# Patient Record
Sex: Female | Born: 1965 | Race: White | Hispanic: No | Smoking: Never smoker
Health system: Southern US, Community
[De-identification: ages and names within clinical notes are randomized; demographics above are authoritative.]

## PROBLEM LIST (undated history)

## (undated) DIAGNOSIS — N200 Calculus of kidney: Secondary | ICD-10-CM

## (undated) DIAGNOSIS — J45909 Unspecified asthma, uncomplicated: Secondary | ICD-10-CM

## (undated) DIAGNOSIS — N390 Urinary tract infection, site not specified: Secondary | ICD-10-CM

## (undated) DIAGNOSIS — G935 Compression of brain: Secondary | ICD-10-CM

## (undated) DIAGNOSIS — F419 Anxiety disorder, unspecified: Secondary | ICD-10-CM

## (undated) DIAGNOSIS — F32A Depression, unspecified: Secondary | ICD-10-CM

## (undated) DIAGNOSIS — F329 Major depressive disorder, single episode, unspecified: Secondary | ICD-10-CM

## (undated) DIAGNOSIS — G4730 Sleep apnea, unspecified: Secondary | ICD-10-CM

## (undated) DIAGNOSIS — K760 Fatty (change of) liver, not elsewhere classified: Secondary | ICD-10-CM

## (undated) DIAGNOSIS — K219 Gastro-esophageal reflux disease without esophagitis: Secondary | ICD-10-CM

## (undated) DIAGNOSIS — K529 Noninfective gastroenteritis and colitis, unspecified: Secondary | ICD-10-CM

## (undated) HISTORY — PX: ABDOMINAL HYSTERECTOMY: SHX81

## (undated) HISTORY — PX: BREAST EXCISIONAL BIOPSY: SUR124

## (undated) HISTORY — DX: Depression, unspecified: F32.A

## (undated) HISTORY — PX: CHOLECYSTECTOMY: SHX55

## (undated) HISTORY — DX: Urinary tract infection, site not specified: N39.0

## (undated) HISTORY — DX: Compression of brain: G93.5

## (undated) HISTORY — DX: Major depressive disorder, single episode, unspecified: F32.9

## (undated) HISTORY — PX: CARPAL TUNNEL RELEASE: SHX101

## (undated) HISTORY — PX: SHOULDER SURGERY: SHX246

## (undated) HISTORY — DX: Sleep apnea, unspecified: G47.30

## (undated) HISTORY — DX: Calculus of kidney: N20.0

---

## 2015-11-21 ENCOUNTER — Emergency Department (HOSPITAL_COMMUNITY)
Admission: EM | Admit: 2015-11-21 | Discharge: 2015-11-21 | Disposition: A | Discharge status: Home / Self Care | Payer: Medicaid - Out of State | Attending: Emergency Medicine | Admitting: Emergency Medicine

## 2015-11-21 ENCOUNTER — Encounter (HOSPITAL_COMMUNITY): Payer: Self-pay | Admitting: Emergency Medicine

## 2015-11-21 DIAGNOSIS — Y9289 Other specified places as the place of occurrence of the external cause: Secondary | ICD-10-CM | POA: Insufficient documentation

## 2015-11-21 DIAGNOSIS — Y9389 Activity, other specified: Secondary | ICD-10-CM | POA: Insufficient documentation

## 2015-11-21 DIAGNOSIS — J45909 Unspecified asthma, uncomplicated: Secondary | ICD-10-CM | POA: Insufficient documentation

## 2015-11-21 DIAGNOSIS — Y998 Other external cause status: Secondary | ICD-10-CM | POA: Diagnosis not present

## 2015-11-21 DIAGNOSIS — X509XXA Other and unspecified overexertion or strenuous movements or postures, initial encounter: Secondary | ICD-10-CM | POA: Diagnosis not present

## 2015-11-21 DIAGNOSIS — S99912A Unspecified injury of left ankle, initial encounter: Secondary | ICD-10-CM | POA: Diagnosis not present

## 2015-11-21 HISTORY — DX: Compression of brain: G93.5

## 2015-11-21 HISTORY — DX: Unspecified asthma, uncomplicated: J45.909

## 2015-11-21 HISTORY — DX: Gastro-esophageal reflux disease without esophagitis: K21.9

## 2015-11-21 NOTE — ED Notes (Signed)
Was unable to find the pt to recheck vitals at aprox [phone]

## 2015-11-21 NOTE — ED Notes (Signed)
Called pt for room placement in sub-waiting and in main waiting room twice with no answer. Lawson Fiscal, RN made aware.

## 2015-11-21 NOTE — ED Notes (Signed)
Patient called several times to go to radiology and to be placed in room but no answer

## 2015-11-21 NOTE — ED Notes (Signed)
Pt sts left ankle pain after twisting 2 hours ago with pain in heel; CMS intact

## 2016-03-30 ENCOUNTER — Encounter (HOSPITAL_COMMUNITY): Payer: Self-pay | Admitting: Emergency Medicine

## 2016-03-30 ENCOUNTER — Observation Stay (HOSPITAL_COMMUNITY)
Admission: EM | Admit: 2016-03-30 | Discharge: 2016-03-31 | Disposition: A | Discharge status: Home / Self Care | Payer: Medicaid Other | Attending: Family Medicine | Admitting: Family Medicine

## 2016-03-30 DIAGNOSIS — I6529 Occlusion and stenosis of unspecified carotid artery: Secondary | ICD-10-CM | POA: Diagnosis not present

## 2016-03-30 DIAGNOSIS — R350 Frequency of micturition: Secondary | ICD-10-CM | POA: Insufficient documentation

## 2016-03-30 DIAGNOSIS — M25512 Pain in left shoulder: Secondary | ICD-10-CM | POA: Insufficient documentation

## 2016-03-30 DIAGNOSIS — R079 Chest pain, unspecified: Secondary | ICD-10-CM | POA: Diagnosis present

## 2016-03-30 DIAGNOSIS — Z8249 Family history of ischemic heart disease and other diseases of the circulatory system: Secondary | ICD-10-CM | POA: Insufficient documentation

## 2016-03-30 DIAGNOSIS — E785 Hyperlipidemia, unspecified: Secondary | ICD-10-CM | POA: Insufficient documentation

## 2016-03-30 DIAGNOSIS — K219 Gastro-esophageal reflux disease without esophagitis: Secondary | ICD-10-CM | POA: Insufficient documentation

## 2016-03-30 DIAGNOSIS — F329 Major depressive disorder, single episode, unspecified: Secondary | ICD-10-CM | POA: Insufficient documentation

## 2016-03-30 DIAGNOSIS — R631 Polydipsia: Secondary | ICD-10-CM | POA: Diagnosis not present

## 2016-03-30 DIAGNOSIS — I25110 Atherosclerotic heart disease of native coronary artery with unstable angina pectoris: Secondary | ICD-10-CM | POA: Insufficient documentation

## 2016-03-30 DIAGNOSIS — Z7982 Long term (current) use of aspirin: Secondary | ICD-10-CM | POA: Diagnosis not present

## 2016-03-30 DIAGNOSIS — G4733 Obstructive sleep apnea (adult) (pediatric): Secondary | ICD-10-CM | POA: Insufficient documentation

## 2016-03-30 DIAGNOSIS — E669 Obesity, unspecified: Secondary | ICD-10-CM | POA: Diagnosis not present

## 2016-03-30 DIAGNOSIS — R072 Precordial pain: Principal | ICD-10-CM | POA: Insufficient documentation

## 2016-03-30 DIAGNOSIS — J45909 Unspecified asthma, uncomplicated: Secondary | ICD-10-CM | POA: Diagnosis not present

## 2016-03-30 DIAGNOSIS — K760 Fatty (change of) liver, not elsewhere classified: Secondary | ICD-10-CM | POA: Insufficient documentation

## 2016-03-30 HISTORY — DX: Anxiety disorder, unspecified: F41.9

## 2016-03-30 HISTORY — DX: Noninfective gastroenteritis and colitis, unspecified: K52.9

## 2016-03-30 HISTORY — DX: Fatty (change of) liver, not elsewhere classified: K76.0

## 2016-03-30 MED ORDER — NITROGLYCERIN 0.4 MG SL SUBL
0.4000 mg | SUBLINGUAL_TABLET | SUBLINGUAL | Status: DC | PRN
  Administered 2016-03-31: 0.4 mg via SUBLINGUAL
  Filled 2016-03-30: qty 1

## 2016-03-30 NOTE — ED Provider Notes (Addendum)
CSN: [id number]     Arrival date & time 03/30/16  2342 History  By signing my name below, I, Vista Minkobert Ross, attest that this documentation has been prepared under the direction and in the presence of Jaci Carrelhristopher Miklo Aken, MD. Electronically signed, Vista Minkobert Ross, ED Scribe. 03/31/2016. 12:03 AM.    Chief Complaint  Patient presents with  . Chest Pain   The history is provided by the patient. No language interpreter was used.   HPI Comments: Megan [name] is a 50 y.o. female with a PMHx of cholecystectomy, carotid artery disease, and hysterectomy, Colitis, brought in by ambulance, who presents to the Emergency Department complaining of intermittent sharp central chest pain that radiates to her left chest, left arm and neck. Pt also reports associated tingling in her left hand. Pt states her chest pain has gradually worsened today. Pt reports that she was at the grocery store earlier today when she started to feel flushed and nauseas and felt as though she was going to pass out. Pt further reports that she has had increased thirst recently. Pt states that she has never been diagnosed with hypertension but reports that she has had occasional high blood pressure. Pt also reports she takes medication for high cholesterol. Pt reports no alleviating or exacerbating factors. Pt denies being diabetic. Pt states that she is not a smoker.   Past Medical History  Diagnosis Date  . Asthma   . GERD (gastroesophageal reflux disease)   . Chiari malformation type I (HCC)    History reviewed. No pertinent past surgical history. No family history on file. Social History  Substance Use Topics  . Smoking status: Never Smoker   . Smokeless tobacco: None  . Alcohol Use: No   OB History    No data available     Review of Systems  Constitutional: Positive for diaphoresis.  Cardiovascular: Positive for chest pain.  Gastrointestinal: Positive for nausea.  All other systems reviewed and are  negative.     Allergies  Sulfa antibiotics and Nsaids  Home Medications   Prior to Admission medications   Medication Sig Start Date End Date Taking? Authorizing Provider  aspirin EC 81 MG tablet Take 81 mg by mouth daily.   Yes Historical Provider, MD  DULERA 100-5 MCG/ACT AERO Take 2 puffs by mouth 2 (two) times daily. 01/01/16  Yes Historical Provider, MD  ibuprofen (ADVIL,MOTRIN) 800 MG tablet Take 800 mg by mouth 2 (two) times daily as needed for mild pain.  03/06/16  Yes Historical Provider, MD  loratadine (CLARITIN) 10 MG tablet Take 10 mg by mouth daily. 03/06/16  Yes Historical Provider, MD  montelukast (SINGULAIR) 10 MG tablet Take 10 mg by mouth daily. 03/06/16  Yes Historical Provider, MD  NEXIUM 24HR 20 MG capsule Take 40 mg by mouth daily. 03/06/16  Yes Historical Provider, MD  Omega-3 Fatty Acids (FISH OIL) 1000 MG CAPS Take 1,000 mg by mouth 2 (two) times daily.   Yes Historical Provider, MD  promethazine (PHENERGAN) 25 MG tablet Take 25 mg by mouth every 6 (six) hours as needed for nausea or vomiting.   Yes Historical Provider, MD  ranitidine (ZANTAC) 150 MG tablet Take 150 mg by mouth 2 (two) times daily. 02/12/16  Yes Historical Provider, MD  sertraline (ZOLOFT) 100 MG tablet Take 100 mg by mouth daily. 03/06/16  Yes Historical Provider, MD  simvastatin (ZOCOR) 10 MG tablet Take 10 mg by mouth at bedtime. 01/01/16  Yes Historical Provider, MD   BP 100/77 mmHg  Pulse 76  Temp(Src) 98 F (36.7 C) (Oral)  Resp 13  SpO2 97% Physical Exam  Constitutional: She is oriented to person, place, and time. She appears well-developed and well-nourished. No distress.  HENT:  Head: Normocephalic and atraumatic.  Right Ear: Hearing normal.  Left Ear: Hearing normal.  Nose: Nose normal.  Mouth/Throat: Oropharynx is clear and moist and mucous membranes are normal.  Eyes: Conjunctivae and EOM are normal. Pupils are equal, round, and reactive to light.  Neck: Normal range of motion. Neck  supple.  Cardiovascular: Regular rhythm, S1 normal and S2 normal.  Exam reveals no gallop and no friction rub.   No murmur heard. Pulmonary/Chest: Effort normal and breath sounds normal. No respiratory distress. She exhibits no tenderness.  Abdominal: Soft. Normal appearance and bowel sounds are normal. There is no hepatosplenomegaly. There is no tenderness. There is no rebound, no guarding, no tenderness at McBurney's point and negative Murphy's sign. No hernia.  Musculoskeletal: Normal range of motion.  Neurological: She is alert and oriented to person, place, and time. She has normal strength. No cranial nerve deficit or sensory deficit. Coordination normal. GCS eye subscore is 4. GCS verbal subscore is 5. GCS motor subscore is 6.  Skin: Skin is warm, dry and intact. No rash noted. No cyanosis.  Psychiatric: She has a normal mood and affect. Her speech is normal and behavior is normal. Thought content normal.  Nursing note and vitals reviewed.   ED Course  Procedures   COORDINATION OF CARE: 12:00 AM-Will order blood work and EKG. Discussed treatment plan with pt at bedside and pt agreed to plan.   Labs Review Labs Reviewed  CBC WITH DIFFERENTIAL/PLATELET - Abnormal; Notable for the following:    Hemoglobin 11.3 (*)    HCT 34.8 (*)    Lymphs Abs 4.1 (*)    All other components within normal limits  COMPREHENSIVE METABOLIC PANEL - Abnormal; Notable for the following:    Total Protein 6.2 (*)    All other components within normal limits  TROPONIN I  LIPASE, BLOOD  URINALYSIS, ROUTINE W REFLEX MICROSCOPIC (NOT AT [facility])    Imaging Review Dg Chest Port 1 View  03/31/2016  CLINICAL DATA:  Acute onset of mid chest pain.  Initial encounter. EXAM: PORTABLE CHEST 1 VIEW COMPARISON:  None. FINDINGS: The lungs are well-aerated and clear. There is no evidence of focal opacification, pleural effusion or pneumothorax. The cardiomediastinal silhouette is within normal limits. No acute osseous  abnormalities are seen. IMPRESSION: No acute cardiopulmonary process seen. Electronically Signed   By: Roanna RaiderJeffery  Chang M.D.   On: 03/31/2016 00:12   I have personally reviewed and evaluated these images and lab results as part of my medical decision-making.   EKG Interpretation   Date/Time:  Tuesday March 30 2016 23:54:30 EDT Ventricular Rate:  78 PR Interval:    QRS Duration: 90 QT Interval:  388 QTC Calculation: 442 R Axis:   36 Text Interpretation:  Sinus rhythm Normal ECG Confirmed by Chandlar Guice  MD,  Zyler Hyson [phone]) on 03/30/2016 11:56:46 PM      MDM   Final diagnoses:  Chest pain, unspecified chest pain type    Presents to the emergency department for evaluation of chest pain. Patient has been experiencing intermittent pain for 3 days. She reports occasional sharp component, but mostly complaining of heaviness and tightness in the chest that radiates to the left neck and the left shoulder and arm. She has not, however, noticed any clear exertional component. She  has had some nausea, flushing and feeling like she is going to pass out associated with the pain. She is not short of breath.  HEART Score for Major Cardiac Events from [email or web address]  on 03/30/2016  RESULT SUMMARY: 4 points Moderate Score (4-6 points) Risk of MACE of 12-16.6%.  INPUTS: History -> 1 = Moderately suspicious EKG -> 0 = Normal Age -> 1 = 45-65 Risk factors -> 2 = ?3 risk factors or history of atherosclerotic disease (patient has multiple risk factors AND has known vascular (carotid) disease) Initial troponin -> 0 = ?normal limit  Patient does have multiple cardiac risk factors as outlined above. She is felt to be moderate risk for cardiac etiology and therefore will require hospitalization for cardiac rule out.  I personally performed the services described in this documentation, which was scribed in my presence. The recorded information has been reviewed and is accurate.     Gilda Crease,  MD 03/31/16 1610  Gilda Crease, MD 04/01/16 [phone]

## 2016-03-30 NOTE — ED Notes (Signed)
Pt in EMS from home, reports CP present X3 days. Central with radiation to L arm, L neck. Denies SOB.

## 2016-03-30 NOTE — ED Notes (Signed)
Pt took 324 ASA at home, was admin 1NTG en route with no relief.

## 2016-03-31 ENCOUNTER — Emergency Department (HOSPITAL_COMMUNITY): Payer: Medicaid Other

## 2016-03-31 ENCOUNTER — Other Ambulatory Visit (HOSPITAL_COMMUNITY): Payer: Medicaid - Out of State

## 2016-03-31 ENCOUNTER — Encounter (HOSPITAL_COMMUNITY): Payer: Self-pay | Admitting: General Practice

## 2016-03-31 DIAGNOSIS — R079 Chest pain, unspecified: Secondary | ICD-10-CM | POA: Diagnosis present

## 2016-03-31 DIAGNOSIS — M25512 Pain in left shoulder: Secondary | ICD-10-CM | POA: Diagnosis not present

## 2016-03-31 DIAGNOSIS — R0789 Other chest pain: Secondary | ICD-10-CM | POA: Diagnosis not present

## 2016-03-31 DIAGNOSIS — F419 Anxiety disorder, unspecified: Secondary | ICD-10-CM | POA: Diagnosis not present

## 2016-03-31 LAB — URINALYSIS, ROUTINE W REFLEX MICROSCOPIC
BILIRUBIN URINE: NEGATIVE
Glucose, UA: NEGATIVE mg/dL
HGB URINE DIPSTICK: NEGATIVE
KETONES UR: NEGATIVE mg/dL
Leukocytes, UA: NEGATIVE
NITRITE: NEGATIVE
PH: 5.5 (ref 5.0–8.0)
Protein, ur: NEGATIVE mg/dL
SPECIFIC GRAVITY, URINE: 1.028 (ref 1.005–1.030)

## 2016-03-31 LAB — T4, FREE: FREE T4: 0.7 ng/dL (ref 0.61–1.12)

## 2016-03-31 LAB — COMPREHENSIVE METABOLIC PANEL
ALBUMIN: 3.6 g/dL (ref 3.5–5.0)
ALBUMIN: 3.6 g/dL (ref 3.5–5.0)
ALK PHOS: 57 U/L (ref 38–126)
ALT: 20 U/L (ref 14–54)
ALT: 21 U/L (ref 14–54)
AST: 18 U/L (ref 15–41)
AST: 25 U/L (ref 15–41)
Alkaline Phosphatase: 57 U/L (ref 38–126)
Anion gap: 6 (ref 5–15)
Anion gap: 8 (ref 5–15)
BUN: 16 mg/dL (ref 6–20)
BUN: 17 mg/dL (ref 6–20)
CHLORIDE: 102 mmol/L (ref 101–111)
CHLORIDE: 108 mmol/L (ref 101–111)
CO2: 24 mmol/L (ref 22–32)
CO2: 30 mmol/L (ref 22–32)
CREATININE: 0.79 mg/dL (ref 0.44–1.00)
Calcium: 9.1 mg/dL (ref 8.9–10.3)
Calcium: 9.3 mg/dL (ref 8.9–10.3)
Creatinine, Ser: 0.69 mg/dL (ref 0.44–1.00)
GFR calc Af Amer: >60 mL/min (ref >60)
GFR calc Af Amer: >60 mL/min (ref >60)
GLUCOSE: 102 mg/dL — AB (ref 65–99)
Glucose, Bld: 98 mg/dL (ref 65–99)
POTASSIUM: 4.3 mmol/L (ref 3.5–5.1)
POTASSIUM: 5 mmol/L (ref 3.5–5.1)
SODIUM: 138 mmol/L (ref 135–145)
Sodium: 140 mmol/L (ref 135–145)
Total Bilirubin: 0.7 mg/dL (ref 0.3–1.2)
Total Bilirubin: 0.9 mg/dL (ref 0.3–1.2)
Total Protein: 6.2 g/dL — ABNORMAL LOW (ref 6.5–8.1)
Total Protein: 6.5 g/dL (ref 6.5–8.1)

## 2016-03-31 LAB — TROPONIN I
Troponin I: <0.03 ng/mL (ref <0.03)
Troponin I: <0.03 ng/mL (ref <0.03)

## 2016-03-31 LAB — CBC WITH DIFFERENTIAL/PLATELET
BASOS ABS: 0 10*3/uL (ref 0.0–0.1)
BASOS PCT: 0 %
EOS ABS: 0.3 10*3/uL (ref 0.0–0.7)
EOS PCT: 3 %
HCT: 34.8 % — ABNORMAL LOW (ref 36.0–46.0)
Hemoglobin: 11.3 g/dL — ABNORMAL LOW (ref 12.0–15.0)
Lymphocytes Relative: 39 %
Lymphs Abs: 4.1 10*3/uL — ABNORMAL HIGH (ref 0.7–4.0)
MCH: 29 pg (ref 26.0–34.0)
MCHC: 32.5 g/dL (ref 30.0–36.0)
MCV: 89.5 fL (ref 78.0–100.0)
MONO ABS: 0.7 10*3/uL (ref 0.1–1.0)
Monocytes Relative: 7 %
Neutro Abs: 5.3 10*3/uL (ref 1.7–7.7)
Neutrophils Relative %: 51 %
PLATELETS: 317 10*3/uL (ref 150–400)
RBC: 3.89 MIL/uL (ref 3.87–5.11)
RDW: 13.5 % (ref 11.5–15.5)
WBC: 10.5 10*3/uL (ref 4.0–10.5)

## 2016-03-31 LAB — CBC
HEMATOCRIT: 35.1 % — AB (ref 36.0–46.0)
HEMOGLOBIN: 11.4 g/dL — AB (ref 12.0–15.0)
MCH: 29.1 pg (ref 26.0–34.0)
MCHC: 32.5 g/dL (ref 30.0–36.0)
MCV: 89.5 fL (ref 78.0–100.0)
Platelets: 346 10*3/uL (ref 150–400)
RBC: 3.92 MIL/uL (ref 3.87–5.11)
RDW: 13.6 % (ref 11.5–15.5)
WBC: 10.7 10*3/uL — ABNORMAL HIGH (ref 4.0–10.5)

## 2016-03-31 LAB — TSH: TSH: 4.893 u[IU]/mL — AB (ref 0.350–4.500)

## 2016-03-31 LAB — LIPID PANEL
CHOL/HDL RATIO: 7.8 ratio
CHOLESTEROL: 296 mg/dL — AB (ref 0–200)
HDL: 38 mg/dL — ABNORMAL LOW (ref >40)
LDL CALC: 212 mg/dL — AB (ref 0–99)
Triglycerides: 230 mg/dL — ABNORMAL HIGH (ref <150)
VLDL: 46 mg/dL — AB (ref 0–40)

## 2016-03-31 LAB — LIPASE, BLOOD: LIPASE: 27 U/L (ref 11–51)

## 2016-03-31 MED ORDER — MOMETASONE FURO-FORMOTEROL FUM 100-5 MCG/ACT IN AERO
2.0000 | INHALATION_SPRAY | Freq: Two times a day (BID) | RESPIRATORY_TRACT | Status: DC
Start: 2016-03-31 — End: 2016-03-31
  Administered 2016-03-31: 2 via RESPIRATORY_TRACT
  Filled 2016-03-31 (×2): qty 8.8

## 2016-03-31 MED ORDER — ONDANSETRON HCL 4 MG/2ML IJ SOLN
4.0000 mg | Freq: Four times a day (QID) | INTRAMUSCULAR | Status: DC | PRN
  Administered 2016-03-31 (×2): 4 mg via INTRAVENOUS
  Filled 2016-03-31 (×2): qty 2

## 2016-03-31 MED ORDER — ENOXAPARIN SODIUM 40 MG/0.4ML ~~LOC~~ SOLN
40.0000 mg | SUBCUTANEOUS | Status: DC
  Administered 2016-03-31: 40 mg via SUBCUTANEOUS
  Filled 2016-03-31: qty 0.4

## 2016-03-31 MED ORDER — MELOXICAM 15 MG PO TABS: 15.0000 mg | ORAL_TABLET | Freq: Every day | ORAL | Status: DC

## 2016-03-31 MED ORDER — MORPHINE SULFATE (PF) 2 MG/ML IV SOLN
2.0000 mg | Freq: Once | INTRAVENOUS | Status: AC
  Administered 2016-03-31: 2 mg via INTRAVENOUS
  Filled 2016-03-31: qty 1

## 2016-03-31 MED ORDER — SERTRALINE HCL 100 MG PO TABS
100.0000 mg | ORAL_TABLET | Freq: Every day | ORAL | Status: DC
  Administered 2016-03-31: 100 mg via ORAL
  Filled 2016-03-31: qty 1

## 2016-03-31 MED ORDER — ONDANSETRON HCL 4 MG/2ML IJ SOLN
4.0000 mg | Freq: Once | INTRAMUSCULAR | Status: AC
  Administered 2016-03-31: 4 mg via INTRAVENOUS
  Filled 2016-03-31: qty 2

## 2016-03-31 MED ORDER — MELOXICAM 7.5 MG PO TABS
15.0000 mg | ORAL_TABLET | Freq: Every day | ORAL | Status: DC
  Administered 2016-03-31: 15 mg via ORAL
  Filled 2016-03-31 (×2): qty 1

## 2016-03-31 MED ORDER — MONTELUKAST SODIUM 10 MG PO TABS
10.0000 mg | ORAL_TABLET | Freq: Every day | ORAL | Status: DC
  Administered 2016-03-31: 10 mg via ORAL
  Filled 2016-03-31: qty 1

## 2016-03-31 MED ORDER — PANTOPRAZOLE SODIUM 40 MG PO TBEC
40.0000 mg | DELAYED_RELEASE_TABLET | Freq: Every day | ORAL | Status: DC
  Administered 2016-03-31: 40 mg via ORAL
  Filled 2016-03-31: qty 1

## 2016-03-31 MED ORDER — FAMOTIDINE 20 MG PO TABS
20.0000 mg | ORAL_TABLET | Freq: Every day | ORAL | Status: DC
  Administered 2016-03-31: 20 mg via ORAL
  Filled 2016-03-31: qty 1

## 2016-03-31 MED ORDER — PROMETHAZINE HCL 25 MG PO TABS
12.5000 mg | ORAL_TABLET | Freq: Once | ORAL | Status: AC
  Administered 2016-03-31: 12.5 mg via ORAL
  Filled 2016-03-31: qty 1

## 2016-03-31 MED ORDER — SIMVASTATIN 10 MG PO TABS: 10.0000 mg | ORAL_TABLET | Freq: Every day | ORAL | Status: DC

## 2016-03-31 MED ORDER — ATORVASTATIN CALCIUM 40 MG PO TABS: 40.0000 mg | ORAL_TABLET | Freq: Every day | ORAL | Status: DC

## 2016-03-31 MED ORDER — ASPIRIN EC 81 MG PO TBEC
81.0000 mg | DELAYED_RELEASE_TABLET | Freq: Every day | ORAL | Status: DC
  Administered 2016-03-31: 81 mg via ORAL
  Filled 2016-03-31: qty 1

## 2016-03-31 MED ORDER — ACETAMINOPHEN 325 MG PO TABS: 650.0000 mg | ORAL_TABLET | Freq: Four times a day (QID) | ORAL | Status: DC | PRN

## 2016-03-31 MED ORDER — TRAMADOL HCL 50 MG PO TABS
50.0000 mg | ORAL_TABLET | Freq: Once | ORAL | Status: AC
  Administered 2016-03-31: 50 mg via ORAL
  Filled 2016-03-31: qty 1

## 2016-03-31 MED ORDER — ACETAMINOPHEN 325 MG PO TABS
650.0000 mg | ORAL_TABLET | ORAL | Status: DC | PRN
  Administered 2016-03-31: 650 mg via ORAL
  Filled 2016-03-31: qty 2

## 2016-03-31 MED ORDER — GI COCKTAIL ~~LOC~~
30.0000 mL | Freq: Once | ORAL | Status: AC
  Administered 2016-03-31: 30 mL via ORAL
  Filled 2016-03-31: qty 30

## 2016-03-31 NOTE — Progress Notes (Signed)
Pt continues to be very nauseated and pain 9/10 in LUQ/flank.  MD paged.

## 2016-03-31 NOTE — Consult Note (Signed)
Cardiology Consult    Patient ID: Megan ModeBobbie Burbano MRN: [id number], DOB/AGE: 50/01/1966   Admit date: 03/30/2016 Date of Consult: 03/31/2016  Primary Physician: No primary care provider on file. Reason for Consult: Chest Pain Primary Cardiologist: New Requesting Provider: Dr. Deirdre Priesthambliss   History of Present Illness    Megan [name] is a 50 y.o. 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  She reports having episodes of shooting chest pain which radiates from her sternum, under her left breast, and into her lower back and has been occurring for several years and can occur with rest or exertion. Yesterday afternoon, she developed an episode of nausea and flushing while sitting in her car. Later that evening, while resting on the couch, she developed a pressure along her sternum which radiated under her left breast and into her left neck. The pressure has been present since, waxing and waning in intensity, but never fully going away. Has noted some relief with Tylenol and Tramadol. The pain is exacerbated by taking a deep breath. No positional changes noted.  She does not have any known prior history of CAD. Does report having carotid stenosis which is monitored on a 3-year basis but has not required intervention. Denies any history of HTN or Type 2 DM. Does have HLD and took Simvastatin 10mg  daily prior to admission.  No current or prior tobacco use. No alcohol or recreational drug use. Reports a family history of CAD with her father having CABG in his 7060's.  While admitted, cyclic troponin values have been negative. Creatinine 0.69. Electrolytes WNL. WBC 10.5, Hgb 11.3, platelets 317. TSH 4.893, free T4 0.70. Lipid Panel with Total Cholesterol 296, triglycerides 230, HDL 38, and LDL 212. EKG shows NSR, HR 78 with no acute ST or  T-wave changes. CXR with no acute cardiopulmonary abnormalities.   Past Medical History   Past Medical History  Diagnosis Date  . Asthma   . GERD (gastroesophageal reflux disease)   . Chiari malformation type I (HCC)   . Colitis   . Fatty liver   . Anxiety     Past Surgical History  Procedure Laterality Date  . Carpal tunnel release Left   . Shoulder surgery Left   . Breast lumpectomy Right   . Abdominal hysterectomy    . Cholecystectomy    . Cesarean section       Allergies  Allergies  Allergen Reactions  . Sulfa Antibiotics Anaphylaxis  . Nsaids Nausea And Vomiting    Inpatient Medications    . aspirin EC  81 mg Oral Daily  . atorvastatin  40 mg Oral q1800  . enoxaparin (LOVENOX) injection  40 mg Subcutaneous Q24H  . famotidine  20 mg Oral Daily  . mometasone-formoterol  2 puff Inhalation BID  . montelukast  10 mg Oral Daily  . pantoprazole  40 mg Oral Daily  . sertraline  100 mg Oral Daily    Family History    Family History  Problem Relation Age of Onset  . Heart attack Father     Required CABG in his 6560's.    Social History    Social History   Social History  . Marital Status: Single    Spouse Name: N/A  . Number of Children: N/A  . Years of Education: N/A   Occupational History  .  Not on file.   Social History Main Topics  . Smoking status: Never Smoker   . Smokeless tobacco: Never Used  . Alcohol Use: No  . Drug Use: No  . Sexual Activity: Not on file   Other Topics Concern  . Not on file   Social History Narrative     Review of Systems    General:  No chills, fever, night sweats or weight changes.  Cardiovascular:  No dyspnea on exertion, edema, orthopnea, palpitations, paroxysmal nocturnal dyspnea. Positive for chest pain.  Dermatological: No rash, lesions/masses Respiratory: No cough, dyspnea Urologic: No hematuria, dysuria Abdominal:   No diarrhea, bright red blood per rectum, melena, or hematemesis. Positive for nausea  and vomiting.  Neurologic:  No visual changes, wkns, changes in mental status. All other systems reviewed and are otherwise negative except as noted above.  Physical Exam    Blood pressure 101/53, pulse 68, temperature 98 F (36.7 C), temperature source Oral, resp. rate 16, height 5\' 3"  (1.6 m), weight 213 lb (96.616 kg), SpO2 91 %.  General: Pleasant, Caucasian female appearing in NAD. Psych: Normal affect. Neuro: Alert and oriented X 3. Moves all extremities spontaneously. HEENT: Normal  Neck: Supple without bruits or JVD. Lungs:  Resp regular and unlabored, CTA without wheezing or rales. Heart: RRR no s3, s4, or murmurs. Tender to palpation along sternum and left pectoral region.  Abdomen: Soft, non-tender, non-distended, BS + x 4.  Extremities: No clubbing, cyanosis or edema. DP/PT/Radials 2+ and equal bilaterally.  Labs    Troponin (Point of Care Test) No results for input(s): TROPIPOC in the last 72 hours.  Recent Labs  03/31/16 0006 03/31/16 0242 03/31/16 0446 03/31/16 0742  TROPONINI <0.03 <0.03 <0.03 <0.03   Lab Results  Component Value Date   WBC 10.7* 03/31/2016   HGB 11.4* 03/31/2016   HCT 35.1* 03/31/2016   MCV 89.5 03/31/2016   PLT 346 03/31/2016     Recent Labs Lab 03/31/16 0446  NA 140  K 4.3  CL 102  CO2 30  BUN 17  CREATININE 0.79  CALCIUM 9.3  PROT 6.5  BILITOT 0.7  ALKPHOS 57  ALT 20  AST 18  GLUCOSE 102*   Lab Results  Component Value Date   CHOL 296* 03/31/2016   HDL 38* 03/31/2016   LDLCALC 212* 03/31/2016   TRIG 230* 03/31/2016   No results found for: LifescapeDDIMER   Radiology Studies    Dg Chest Port 1 View: 03/31/2016  CLINICAL DATA:  Acute onset of mid chest pain.  Initial encounter. EXAM: PORTABLE CHEST 1 VIEW COMPARISON:  None. FINDINGS: The lungs are well-aerated and clear. There is no evidence of focal opacification, pleural effusion or pneumothorax. The cardiomediastinal silhouette is within normal limits. No acute osseous  abnormalities are seen. IMPRESSION: No acute cardiopulmonary process seen. Electronically Signed   By: Roanna RaiderJeffery  Chang M.D.   On: 03/31/2016 00:12    EKG & Cardiac Imaging    EKG:  NSR, HR 78 with no acute ST or T-wave changes   Assessment & Plan    1. Atypical Chest Pain - reports having episodes of shooting pain starting at her sternum and radiating to her back for several years, no exertional component noted. Yesterday, she had an episode of nausea and flushing and saying she "has not felt right since". Developed a chest pressure yesterday evening, which occurred at rest and has been constant since, waxing and waning in intensity. Exacerbated by taking a deep breath and  no positional changes noted. Has relief with Tylenol and Tramadol. - risk factors include HLD, reported carotid stenosis, and family history of CAD. No history of HTN, Type 2 DM, or tobacco use.  - overall her pain seems atypical for a cardiac etiology, for it is reproducible on examination with palpation, making a MSK etiology more likely. No exertional component noted and relieved with PO pain medication. - EKG shows no acute ischemic changes and cyclic troponin values have been negative.  - Would not pursue further invasive ischemic evaluation at this time. Could consider outpatient NST if she had continued symptoms despite NSAID use.  2. HLD - Lipid Panel this admission shows Total Cholesterol 296, Triglycerides 230, HDL 38, and LDL 212. - was on Simvastatin  daily PTA. Has been switched to Atorvastatin  daily by the admitting team. She reports having cramps on Lipitor in the past but is willing to try again.   Signed, Ellsworth Lennox, PA-C 03/31/2016, 12:44 PM Pager: [phone] Patient seen and examined and history reviewed. Agree with above findings and plan. 50 yo WF with history of HL presents for evaluation of refractory chest pain. Her pain is described as sharp left parasternal radiating to left  axilla. Sometimes has more pressure sensation and radiation to her left arm. States she has some chest pain off and on for years. 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 This apparently resolved by age 37.  On exam she is obese.  Lungs are clear CV RRR without gallop, murmur, rub. Chest wall is positive for pain on palpation.   Ecg is normal. Troponin normal x4  Impression: atypical chest pain. Musculoskeletal chest pain by exam. I don't think this is cardiac given normal studies after 3 days of constant pain. I would recommend a trial of NSAIDs for anti-inflammatory effects. No further cardiac work up at this time. Statin for risk reduction. If she has continued pain in the future we could consider outpatient stress testing.  Peter [place], MDFACC 03/31/2016 1:10 PM

## 2016-03-31 NOTE — Progress Notes (Addendum)
Spoke to Dr. Natale MilchLancaster regarding plan for patient.  She is aware that pt is rating pain 9/10, unrelieved by tramadol. She is also aware pt has been having persistent nausea unrelieved by zofran.  Plan, per Family Medicine, is to discharge patient to home.  Dr. Natale MilchLancaster has discussed with patient.  Order for ECHO has been discontinued by cardiology.

## 2016-03-31 NOTE — Progress Notes (Signed)
Pt experienced episode of vomiting s/p breakfast.

## 2016-03-31 NOTE — Discharge Instructions (Signed)
Your pain is due to muscle inflammation in your chest wall. Please begin taking Mobic (meloxicam) once a day. We will discuss your pain at your follow up appointment next Tuesday.   We have also made a change to your medications. Please STOP taking simvastatin, and START taking atorvastatin once a day instead.

## 2016-03-31 NOTE — H&P (Signed)
Family Medicine Teaching [facility] Admission History and Physical Service Pager: [phone]  Patient name: Megan [name] Medical record number: [id number] Date of birth: Oct 22, 1965 Age: 50 y.o. Gender: female  Primary Care Provider: No primary care provider on file. Consultants: None  Code Status: Full Code  Chief Complaint:  Substernal chest pain  Assessment and Plan: Megan [name] is a 50 y.o. female presenting with substernal chest pain radiating to her left shoulder, jaw and arm. PMH is significant for GERD, CAD, rotator cuff surgery, Asthma, OSA, HLD, Megan [name] type I, fatty liver.  # Substernal Chest Pain, acute Patient was admitted with symptoms of substernal chest pain that started 3 days ago and that have been worsening. Patient describes the pain as a substernal sharp pain radiating to her left shoulder and arm, with some numbness and tingling also reported during the episodes. Patient also endorse shortness of breath with chest pain episodes. She also reported some flushing and nausea but denies emesis. Patient rate pain 10/10 at the onset, but pain has decreased to 5/10 since admission. Symptoms are not exertion related. Troponin is <0.03, EKG shows NSR without any evidence of cardiac event, CXR shows no acute cardiopulmonary process. On exam, pain is reproducible with palpation around the epigastric region. Given her history of CAD with diagnosed carotid stenosis, HLD, obesity, HEART score was 4, unstable angina would be at the top of our differential although all markers at the time of admission were negative. Given the fact that pain is reproducible on exam, pain could be MSK related such as chest wall strain. Patient also have a history of GERD, which could explain symptoms, however unlikely in this setting. --Keep patient on Telemetry --Cardiology Consult --Trend Troponin 3x --Repeat EKG in the am --Aspirin 81 mg daily --Nitrostat PRN chest pain --Ondansetron  q6  prn --Morning CBC, CMP  #Left Shoulder Pain Patient reported left shoulder pain associated with her chest pain. However, pain in her shoulder has continued with resolution of chest pain. On exam, patient has limited range of motion, with +5 strentgh, tenderness to shoulder palpation. There is no warmth or redness of the should concerning for an infectious process. Patient has a history of rotator cuff reconstructive surgery. Her history of rotator cuff surgery, with current symptoms in the setting of increased strain ( lifting her young grandson) are more consistent with rotator cuff syndrome --Tylenol 650 mg q4 prn -- consider inpatient shoulder imaging --Follow up outpatient with PCP/Sport Medicine for further assessment  #GERD Patient has a history of GERD well controlled with home regimen. Will continue current treatment protocol. --Famotidine 20 mg po daily --Pantoprazole 40 mg po daily  #Hyperlipidemia Patient has a history of hyperlipidemia,  however patient just moved to the area and has not establed care with a PCP. With no record available to [place], we will continue current treatment regimen will assess need for adjustment with lipid panel results in the morning. --Lipid panel in the am --Switch patient from Simvastatin 10 mg po daily to atorvastatin  daily  #Asthma Patient with a history of asthma, seem to be well controlled. Will continue current regimen. --Continue montekulast   po daily --Continue mometasone-formoterol, 2 puffs  BID  #Polyuria and polydipsia Patient endorses increase thirst and increase frequency in urination for the past three weeks. Patient is obese, with a history of hyperlipidemia and CAD. Although BG is within normal limit during this admission. -Hemoglobin A1c pending.  # Depression- likely exacerbated by stressful home situation - continue home  zoloft - continue to address mood while inpatient as well as safety  FEN/GI: Heart healthy diet,  electrolytes replete as needed, SLIV  Prophylaxis: Enoxaparin 40mg   Disposition: Admit to family medicine teaching service, attending Dr Deirdre Priesthambliss  History of Present Illness:  Megan [name] is a 50 y.o. female with a past medical history significant for GERD, CAD, rotator cuff surgery, asthma, OSA, HLD, Megan Falconerrnold Chiara type I and fatty liver who presented with substernal chest pain radiating across her left chest to her left shoulder and arm with some numbness and tingling of her left arm associated with it. Patient reports that symptoms started 3 days ago intermittently she would have some chest pain, that she described as tightness and pressure in the middle of chest and at times as sharp pain. Episodes last a few minutes and would go away on their own. She did not noticed any aggravating or alleviating factors related with her episodes. Events seemed to be random and were not related to exertion. However, on 7/4 around 8:00 pm patient was at the grocery store when she started experiencing severe substernal pain that she rated 10/10. Patient also describes nausea, flushing and difficulty breathing during episode. Patient felt like she was "going to pass out". Patient decided to come the ED for further assessment. Patient denies any fever, chills, headache, diarrhea, but endorsed some mild coughing which has been chronic in nature. Of note since arriving here to stay with her son and three year old grandson, she has been playing with her grandson and lifting him frequently. She thinks this may have worsened her shoulder pain  In ED, patient was given nitroglycerin with some relief of her pain (5/10 prior to floor admission), EKG, CXR and troponin were ordered. Patient also received a GI cocktail with her history of GERD.  Review Of Systems: Per HPI otherwise the remainder of the systems were negative.  Patient Active Problem List   Diagnosis Date Noted  .  Substernal chest pain 03/31/2016    Past  Medical History: Past Medical History  Diagnosis Date  . Asthma   . GERD (gastroesophageal reflux disease)   . Chiari malformation type I (HCC) Fatty Liver OSA Hyperlipidemia Colitis CAD with Carotid stenosis     Past Surgical History: Rotator cuff repair Carpal tunnel surgery Left hand Cholecystectomy Right breast surgery- non cancerous mass removal  Social History: Social History  Substance Use Topics  . Smoking status: Never Smoker   . Smokeless tobacco: None  . Alcohol Use: No   Additional social history: Patient just moved to [place] from [place] and will be living with her son. Patient mentions being in a relationship in [place] but has decided to move away because she did not feel safe. Her partner is still in [place] Please also refer to relevant sections of EMR.  Family History: Mother- CABG  Allergies and Medications: Allergies  Allergen Reactions  . Sulfa Antibiotics Anaphylaxis  . Nsaids Nausea And Vomiting   No current facility-administered medications on file prior to encounter.   No current outpatient prescriptions on file prior to encounter.    Objective: BP 121/73 mmHg  Pulse 79  Temp(Src) 98 F (36.7 C) (Oral)  Resp 19  SpO2 91% Exam: General: NAD, lying comfortably in bed HENT: Normocephalic Atraumatic, PERRL ENTM: oropharynx clear, MMM Neck: supple with normal range of motion Cardiovascular: regular rate and rhythm, normal S1 and S2, no murmurs auscultated Respiratory: Normal work of breathing, CTAB, no wheezes, rales, or ronchi Abdomen: Soft, non  distended, normal bowel sound, no hepatosplenomegaly, no rebound, guarding MSK: Strength +5 upper and lower extremities, Left shoulder limited ROM when directed to move shoulder (90 degrees abduction max), however had normal, not apparently painful ROM when moving spontaneously Skin: Warm and dry, no rashes noted on exam, no lesions or bruises noted Neuro: No focal deficits Psych: Normal  mood and affect, normal speech, thought content and behavior.  Labs and Imaging: CBC BMET   Recent Labs Lab 03/31/16 0006  WBC 10.5  HGB 11.3*  HCT 34.8*  PLT 317    Recent Labs Lab 03/31/16 0006  NA 138  K 5.0  CL 108  CO2 24  BUN 16  CREATININE 0.69  GLUCOSE 98  CALCIUM 9.1    Troponin I <0.03 Lipase 27  Total Protein 6.2 AST25 ALT21 T Bili 0.9  UA was unremarkable  EKG Ventricular Rate: 78 PR Interval:  QRS Duration: 90 QT Interval: 388 QTC Calculation: 442 R Axis: 36  Imaging  Chest X ray The lungs are well-aerated and clear. There is no evidence of focal opacification, pleural effusion or pneumothorax.The cardiomediastinal silhouette is within normal limits. No acute osseous abnormalities are seen. IMPRESSION: No acute cardiopulmonary process seen  Lovena NeighboursAbdoulaye Melisssa Donner, MD 03/31/2016, 1:56 AM PGY-1, [facility] FPTS Intern pager: [phone], text pages welcome   I have seen and examined the patient. I have read and agree with the above note. My changes are noted in blue.  Alyssa A. Kennon RoundsHaney MD, MS Family Medicine Resident PGY-2 Pager [phone]

## 2016-03-31 NOTE — Progress Notes (Signed)
Pt complaining of pain 9/10 in upper left quadrant/flank area.  MD paged for PRN med orders, pt only has APAP ordered at this time.  MD stated she needs to discuss with her team and will get back to me.

## 2016-04-01 LAB — HEMOGLOBIN A1C
HEMOGLOBIN A1C: 5.8 % — AB (ref 4.8–5.6)
Mean Plasma Glucose: 120 mg/dL

## 2016-04-02 NOTE — Discharge Summary (Signed)
[facility]  Patient name: Megan [name] ModeBobbie Dingwall Medical record number: [id number] Date of birth: 08/07/1966 Age: 50 y.o. Gender: female Date of Admission: 03/30/2016  Date of Discharge: 03/31/16 Admitting Physician: Carney LivingMarshall L Chambliss, MD  Primary Care Provider: No primary care provider on file. Consultants: Cardiology  Indication for Hospitalization: Chest pain rule out   Discharge Diagnoses/Problem List:  Substernal chest and shoulder pain  Disposition: Home   Discharge Condition: Stable   Discharge Exam: General: NAD, lying comfortably in bed HENT: Normocephalic Atraumatic, PERRL ENTM: oropharynx clear, MMM Neck: supple with normal range of motion Cardiovascular: regular rate and rhythm, normal S1 and S2, no murmurs auscultated Respiratory: Normal work of breathing, CTAB, no wheezes, rales, or ronchi Abdomen: Soft, non distended, normal bowel sound, no hepatosplenomegaly, no rebound, guarding MSK: Strength +5 upper and lower extremities, Left shoulder limited ROM when directed to move shoulder (90 degrees abduction max), however had normal, not apparently painful ROM when moving spontaneously Skin: Warm and dry, no rashes noted on exam, no lesions or bruises noted Neuro: No focal deficits Psych: Normal mood and affect, normal speech, thought content and behavior.  Brief Hospital Course:  Patient was admitted on 03/30/16 for substernal chest pain radiating to her shoulder and arm. Pain was described as pressure and feeling of tightness in her chest. In the ED patient, had troponin x1 negative, a EKG without evidence of ischemic changes and a chest X ray that show no evidence of cardiopulmonary process. Patient was admitted, for chest pain rule out, an troponin were cycled and were all negative. Patient pain was reproducible on palpation during exam which pointed towards and MSK etiology. Cardiology was consulted and came to the conclusion  though patient had risk factors ( history of CAD, carotid stenosis, HLD), patient clinical presentation was not consistent with cardiac event. Patient continue to experience pain during hospitalization, moderately control with pain medication. Patient had a history of shoulder pain and was asked to follow with PCP for further assess of chest and shoulder pain with possible stress test if symptoms persevered. Patient was discharged with close follow up with PCP.   Issues for Follow Up:  1.Substernal chest pain/ shoulder pain  Significant Procedures: None  Significant Labs and Imaging:   Recent Labs Lab 03/31/16 0006 03/31/16 0446  WBC 10.5 10.7*  HGB 11.3* 11.4*  HCT 34.8* 35.1*  PLT 317 346    Recent Labs Lab 03/31/16 0006 03/31/16 0446  NA 138 140  K 5.0 4.3  CL 108 102  CO2 24 30  GLUCOSE 98 102*  BUN 16 17  CREATININE 0.69 0.79  CALCIUM 9.1 9.3  ALKPHOS 57 57  AST 25 18  ALT 21 20  ALBUMIN 3.6 3.6    Results/Tests Pending at Time of Discharge: None  Discharge Medications:    Medication List    STOP taking these medications        simvastatin 10 MG tablet  Commonly known as:  ZOCOR      TAKE these medications        aspirin EC 81 MG tablet  Take 81 mg by mouth daily.     atorvastatin 40 MG tablet  Commonly known as:  LIPITOR  Take 1 tablet (40 mg total) by mouth daily at 6 PM.     DULERA 100-5 MCG/ACT Aero  Generic drug:  mometasone-formoterol  Take 2 puffs by mouth 2 (two) times daily.     Fish Oil 1000 MG Caps  Take 1,000 mg by mouth 2 (two) times daily.     ibuprofen 800 MG tablet  Commonly known as:  ADVIL,MOTRIN  Take 800 mg by mouth 2 (two) times daily as needed for mild pain.     loratadine 10 MG tablet  Commonly known as:  CLARITIN  Take 10 mg by mouth daily.     meloxicam 15 MG tablet  Commonly known as:  MOBIC  Take 1 tablet (15 mg total) by mouth daily.     montelukast 10 MG tablet  Commonly known as:  SINGULAIR  Take 10 mg  by mouth daily.     NEXIUM 24HR 20 MG capsule  Generic drug:  esomeprazole  Take 40 mg by mouth daily.     promethazine 25 MG tablet  Commonly known as:  PHENERGAN  Take 25 mg by mouth every 6 (six) hours as needed for nausea or vomiting.     ranitidine 150 MG tablet  Commonly known as:  ZANTAC  Take 150 mg by mouth 2 (two) times daily.     sertraline 100 MG tablet  Commonly known as:  ZOLOFT  Take 100 mg by mouth daily.        Discharge Instructions: Please refer to Patient Instructions section of EMR for full details.  Patient was counseled important signs and symptoms that should prompt return to medical care, changes in medications, dietary instructions, activity restrictions, and follow up appointments.   Follow-Up Appointments:     Follow-up Information    Follow up with Tarri AbernethyAbigail J Lancaster, MD. Go on 04/06/2016.   Specialty:  Family Medicine   Why:  For hospital follow-up at 3 PM   Contact information:   [address] [place] [address] [phone]       Lovena NeighboursAbdoulaye Daeveon Zweber, MD 04/02/2016, 8:56 PM PGY-1, [facility]

## 2016-04-06 ENCOUNTER — Encounter: Payer: Self-pay | Admitting: Internal Medicine

## 2016-04-06 ENCOUNTER — Ambulatory Visit (INDEPENDENT_AMBULATORY_CARE_PROVIDER_SITE_OTHER): Payer: Medicaid Other | Admitting: Psychology

## 2016-04-06 VITALS — BP 133/76 | HR 82 | Temp 98.2°F | Ht 63.0 in | Wt 213.8 lb

## 2016-04-06 DIAGNOSIS — M25512 Pain in left shoulder: Secondary | ICD-10-CM

## 2016-04-06 DIAGNOSIS — F419 Anxiety disorder, unspecified: Secondary | ICD-10-CM

## 2016-04-06 DIAGNOSIS — R079 Chest pain, unspecified: Secondary | ICD-10-CM | POA: Diagnosis not present

## 2016-04-06 MED ORDER — MELOXICAM 15 MG PO TABS: 15.0000 mg | ORAL_TABLET | Freq: Every day | ORAL | Status: DC

## 2016-04-06 NOTE — Patient Instructions (Addendum)
It was nice seeing you today [name]!  For your arm pain, please continue to take Mobic once a day. Please also begin performing the strengthening and stretching exercises I have provided. I will see you back in one month to see if your symptoms are improving.   You will also see Megan [name] in [facility] next Tuesday to discuss your recent anxiety.   If you have any questions or concerns in the meantime, please feel free to call the clinic.   Be well,  Dr. Natale Milch  Shoulder Range of Motion Exercises Shoulder range of motion (ROM) exercises are designed to keep the shoulder moving freely. They are often recommended for people who have shoulder pain. MOVEMENT EXERCISE When you are able, do this exercise 5-6 days per week, or as told by your health care provider. Work toward doing 2 sets of 10 swings. Pendulum Exercise How To Do This Exercise Lying Down 1. Lie face-down on a bed with your abdomen close to the side of the bed. 2. Let your arm hang over the side of the bed. 3. Relax your shoulder, arm, and hand. 4. Slowly and gently swing your arm forward and back. Do not use your neck muscles to swing your arm. They should be relaxed. If you are struggling to swing your arm, have someone gently swing it for you. When you do this exercise for the first time, swing your arm at a 15 degree angle for 15 seconds, or swing your arm 10 times. As pain lessens over time, increase the angle of the swing to 30-45 degrees. 5. Repeat steps 1-4 with the other arm. How To Do This Exercise While Standing 1. Stand next to a sturdy chair or table and hold on to it with your hand.  Bend forward at the waist.  Bend your knees slightly.  Relax your other arm and let it hang limp.  Relax the shoulder blade of the arm that is hanging and let it drop.  While keeping your shoulder relaxed, use body motion to swing your arm in small circles. The first time you do this exercise, swing your arm  for about 30 seconds or 10 times. When you do it next time, swing your arm for a little longer.  Stand up tall and relax.  Repeat steps 1-7, this time changing the direction of the circles. 2. Repeat steps 1-8 with the other arm. STRETCHING EXERCISES Do these exercises 3-4 times per day on 5-6 days per week or as told by your health care provider. Work toward holding the stretch for 20 seconds. Stretching Exercise 1 1. Lift your arm straight out in front of you. 2. Bend your arm 90 degrees at the elbow (right angle) so your forearm goes across your body and looks like the letter "L." 3. Use your other arm to gently pull the elbow forward and across your body. 4. Repeat steps 1-3 with the other arm. Stretching Exercise 2 You will need a towel or rope for this exercise. 1. Bend one arm behind your back with the palm facing outward. 2. Hold a towel with your other hand. 3. Reach the arm that holds the towel above your head, and bend that arm at the elbow. Your wrist should be behind your neck. 4. Use your free hand to grab the free end of the towel. 5. With the higher hand, gently pull the towel up behind you. 6. With the lower hand, pull the towel down behind you. 7.  Repeat steps 1-6 with the other arm. STRENGTHENING EXERCISES Do each of these exercises at four different times of day (sessions) every day or as told by your health care provider. To begin with, repeat each exercise 5 times (repetitions). Work toward doing 3 sets of 12 repetitions or as told by your health care provider. Strengthening Exercise 1 You will need a light weight for this activity. As you grow stronger, you may use a heavier weight. 1. Standing with a weight in your hand, lift your arm straight out to the side until it is at the same height as your shoulder. 2. Bend your arm at 90 degrees so that your fingers are pointing to the ceiling. 3. Slowly raise your hand until your arm is straight up in the air. 4. Repeat  steps 1-3 with the other arm. Strengthening Exercise 2 You will need a light weight for this activity. As you grow stronger, you may use a heavier weight. 1. Standing with a weight in your hand, gradually move your straight arm in an arc, starting at your side, then out in front of you, then straight up over your head. 2. Gradually move your other arm in an arc, starting at your side, then out in front of you, then straight up over your head. 3. Repeat steps 1-2 with the other arm. Strengthening Exercise 3 You will need an elastic band for this activity. As you grow stronger, gradually increase the size of the bands or increase the number of bands that you use at one time. 1. While standing, hold an elastic band in one hand and raise that arm up in the air. 2. With your other hand, pull down the band until that hand is by your side. 3. Repeat steps 1-2 with the other arm.   This information is not intended to replace advice given to you by your health care provider. Make sure you discuss any questions you have with your health care provider.   Document Released: 06/12/2003 Document Revised: 01/28/2015 Document Reviewed: 09/09/2014 Elsevier Interactive Patient Education [employer]2016 Elsevier Inc.

## 2016-04-06 NOTE — Assessment & Plan Note (Signed)
Likely MSK, given physical exam findings and negative cardiac work-up during admission.  - Continue Mobic 15mg  qd - Provided strengthening and stretching exercises to be performed BID - F/u in one month to monitor improvement. If not improved, may consider PT at that time.

## 2016-04-06 NOTE — Progress Notes (Signed)
Dr. Natale MilchLancaster requested a Behavioral Health Consult.   Presenting Issue:  Patient is experiencing increased anxiety following a recent breakup. Patient did not have time to speak with me for long today, so a longer history was not gathered.   Assessment / Plan / Recommendations: [facility], [facility], as she is potentially interested in medication to help manage her anxiety, as well as counseling. Patient expressed interested in attending behavioral health appointment and was scheduled for 7/18 at 2pm.

## 2016-04-06 NOTE — Progress Notes (Signed)
   Subjective:    Patient ID: Megan [name], female    DOB: [birth year], 50 y.o.   MRN: [id number]  HPI  Patient presents for hospital follow up after admission for ACS rule out.   Arm pain Patient was admitted for ACS rule out from 03/30/2016-03/31/2016 due to substernal chest and shoulder pain on presentation. No cardiac etiology for her symptoms was identified during admission, with negative troponins, EKGs with NSR, and unremarkable CXR. Patient was seen by cardiology, who felt presentation was not consistent with a cardiac event. Patient's pain was reproducible on palpation, so MSK etiology was deemed most likely cause. She was discharged with daily Mobic.  Patient reports no improvement in symptoms since discharge one week ago. She has been taking Mobic daily as prescribed. She reports the same arm pain on the L, with some persistent substernal chest pain. Also endorses some tingling of the L arm and weakness when styling her hair. Denies dizziness, but does endorse nausea. She has been prescribed phenergan in the past for nausea, and says this does alleviate her symptoms. Also reports episodes of hot flashes and flushing.  Of note, patient had rotator cuff surgery 4 years ago on the L shoulder.   Anxiety Patient reports recent anxiety while going through a breakup. Of note, her son in law who is present for the encounter reports that her anxiety has been present for the past two years throughout her relationship with the aforementioned partner. Patient reports feelings of intense anxiety with concomitant tremors and palpitations. She reports a history of anxiety treated by Klonopin '1mg'$  PRN in the past.    Review of Systems See HPI.     Objective:   Physical Exam  Constitutional: She is oriented to person, place, and time. She appears well-developed and well-nourished. No distress.  HENT:  Head: Normocephalic and atraumatic.  Cardiovascular: Normal rate, regular rhythm and normal heart  sounds.   No murmur heard. Pulmonary/Chest: Effort normal and breath sounds normal. No respiratory distress. She has no wheezes.  Musculoskeletal:  TTP of L shoulder and L bicep. 5/5 strength UE bilaterally. 5/5 grip strength bilaterally. Mild TTP of sternum. No bony abnormalities of UE noted.   Neurological: She is alert and oriented to person, place, and time.  Psychiatric:  Briefly tearful when discussing break up      Assessment & Plan:  Left shoulder pain Likely MSK, given physical exam findings and negative cardiac work-up during admission.  - Continue Mobic '15mg'$  qd - Provided strengthening and stretching exercises to be performed BID - F/u in one month to monitor improvement. If not improved, may consider PT at that time.   Anxiety Likely 2/2 acute stressful event, as patient identifies onset of anxiety with current break up. As such, medication not first line treatment. [name] with [name] met with patient, and patient is interested in being seen at [name].  - Appt at [name] - Can consider medication if symptoms persist despite [facility] appointments (patient previously on Klonopin though most likely would not begin with this med)   Adin Hector, MD, MPH PGY-2 [name] Pager [phone]

## 2016-04-06 NOTE — Assessment & Plan Note (Signed)
Likely 2/2 acute stressful event, as patient identifies onset of anxiety with current break up. As such, medication not first line treatment. [name] with [facility] met with patient, and patient is interested in being seen at [facility].  - Appt at [facility] on 7/18 - Can consider medication if symptoms persist despite [place] appointments (patient previously on Klonopin though most likely would not begin with this med)

## 2016-04-13 ENCOUNTER — Telehealth: Payer: Self-pay

## 2016-04-13 ENCOUNTER — Ambulatory Visit: Payer: Medicaid Other

## 2016-04-13 NOTE — Telephone Encounter (Signed)
[facility] called Megan [name] to follow up after missed appointment today. Patient states she forgot about the appointment and apologized. Patient would like to meet with me since she has already met me and I let her know that I am only here on Tuesdays. Followup was scheduled for Tuesday July 25th at 2pm.

## 2016-04-20 ENCOUNTER — Ambulatory Visit: Payer: Medicaid Other

## 2016-05-04 ENCOUNTER — Ambulatory Visit: Payer: Medicaid - Out of State | Admitting: Internal Medicine

## 2017-03-01 ENCOUNTER — Encounter (HOSPITAL_COMMUNITY): Payer: Self-pay | Admitting: Emergency Medicine

## 2017-03-01 DIAGNOSIS — M79605 Pain in left leg: Secondary | ICD-10-CM | POA: Diagnosis not present

## 2017-03-01 DIAGNOSIS — Z5321 Procedure and treatment not carried out due to patient leaving prior to being seen by health care provider: Secondary | ICD-10-CM | POA: Diagnosis not present

## 2017-03-01 DIAGNOSIS — M79604 Pain in right leg: Secondary | ICD-10-CM | POA: Insufficient documentation

## 2017-03-01 LAB — CBC WITH DIFFERENTIAL/PLATELET
Basophils Absolute: 0 10*3/uL (ref 0.0–0.1)
Basophils Relative: 0 %
Eosinophils Absolute: 0.3 10*3/uL (ref 0.0–0.7)
Eosinophils Relative: 3 %
HEMATOCRIT: 36 % (ref 36.0–46.0)
HEMOGLOBIN: 12.2 g/dL (ref 12.0–15.0)
LYMPHS ABS: 4.1 10*3/uL — AB (ref 0.7–4.0)
LYMPHS PCT: 32 %
MCH: 29.2 pg (ref 26.0–34.0)
MCHC: 33.9 g/dL (ref 30.0–36.0)
MCV: 86.1 fL (ref 78.0–100.0)
MONOS PCT: 6 %
Monocytes Absolute: 0.8 10*3/uL (ref 0.1–1.0)
NEUTROS ABS: 7.4 10*3/uL (ref 1.7–7.7)
NEUTROS PCT: 59 %
Platelets: 391 10*3/uL (ref 150–400)
RBC: 4.18 MIL/uL (ref 3.87–5.11)
RDW: 13.7 % (ref 11.5–15.5)
WBC: 12.6 10*3/uL — ABNORMAL HIGH (ref 4.0–10.5)

## 2017-03-01 NOTE — ED Triage Notes (Signed)
Pt is c/o pain to both legs and feet   Pt states when she goes from sitting to standing the pain in her feet is unbearable  Pt states he legs ache and her knees ache  Pt states her hands and feet have been swelling

## 2017-03-02 ENCOUNTER — Emergency Department (HOSPITAL_COMMUNITY)
Admission: EM | Admit: 2017-03-02 | Discharge: 2017-03-02 | Disposition: A | Discharge status: Home / Self Care | Payer: Medicaid - Out of State | Attending: Emergency Medicine | Admitting: Emergency Medicine

## 2017-03-02 LAB — BASIC METABOLIC PANEL
Anion gap: 10 (ref 5–15)
BUN: 10 mg/dL (ref 6–20)
CALCIUM: 9.4 mg/dL (ref 8.9–10.3)
CO2: 24 mmol/L (ref 22–32)
CREATININE: 0.62 mg/dL (ref 0.44–1.00)
Chloride: 105 mmol/L (ref 101–111)
GFR calc non Af Amer: >60 mL/min (ref >60)
Glucose, Bld: 106 mg/dL — ABNORMAL HIGH (ref 65–99)
Potassium: 3.9 mmol/L (ref 3.5–5.1)
Sodium: 139 mmol/L (ref 135–145)

## 2017-03-02 NOTE — ED Notes (Signed)
Patient seen walking out of department with family.

## 2017-04-11 ENCOUNTER — Encounter (HOSPITAL_COMMUNITY): Payer: Self-pay | Admitting: Emergency Medicine

## 2017-04-11 ENCOUNTER — Emergency Department (HOSPITAL_COMMUNITY): Payer: Medicaid Other

## 2017-04-11 ENCOUNTER — Emergency Department (HOSPITAL_COMMUNITY)
Admission: EM | Admit: 2017-04-11 | Discharge: 2017-04-11 | Discharge status: Against Medical Advice | Payer: Medicaid Other | Attending: Emergency Medicine | Admitting: Emergency Medicine

## 2017-04-11 DIAGNOSIS — R079 Chest pain, unspecified: Secondary | ICD-10-CM | POA: Insufficient documentation

## 2017-04-11 LAB — BASIC METABOLIC PANEL
Anion gap: 10 (ref 5–15)
BUN: 14 mg/dL (ref 6–20)
CALCIUM: 9.8 mg/dL (ref 8.9–10.3)
CO2: 26 mmol/L (ref 22–32)
CREATININE: 0.75 mg/dL (ref 0.44–1.00)
Chloride: 101 mmol/L (ref 101–111)
Glucose, Bld: 114 mg/dL — ABNORMAL HIGH (ref 65–99)
Potassium: 4 mmol/L (ref 3.5–5.1)
SODIUM: 137 mmol/L (ref 135–145)

## 2017-04-11 LAB — CBC
HCT: 39.1 % (ref 36.0–46.0)
Hemoglobin: 12.7 g/dL (ref 12.0–15.0)
MCH: 28.8 pg (ref 26.0–34.0)
MCHC: 32.5 g/dL (ref 30.0–36.0)
MCV: 88.7 fL (ref 78.0–100.0)
PLATELETS: 450 10*3/uL — AB (ref 150–400)
RBC: 4.41 MIL/uL (ref 3.87–5.11)
RDW: 13.5 % (ref 11.5–15.5)
WBC: 13.8 10*3/uL — AB (ref 4.0–10.5)

## 2017-04-11 LAB — I-STAT TROPONIN, ED: Troponin i, poc: 0 ng/mL (ref 0.00–0.08)

## 2017-04-11 NOTE — ED Triage Notes (Signed)
Pt reports centralized chest "heaviness" since yesterday.  Reports nausea.  Denies v/d/sob/diaphoresis.  Pt crying in Triage, scared, her sister died May 15 of a heart attack.

## 2017-04-11 NOTE — ED Notes (Signed)
Pt states she wants to leave, encouraged to stay but she refused.

## 2017-04-11 NOTE — ED Notes (Signed)
Pt requesting update, Lurena JoinerRebecca AD speaking with pt to apologize for delay.

## 2017-04-18 ENCOUNTER — Encounter: Payer: Self-pay | Admitting: Family Medicine

## 2017-04-18 ENCOUNTER — Other Ambulatory Visit: Payer: Self-pay | Admitting: Family Medicine

## 2017-04-18 ENCOUNTER — Ambulatory Visit (INDEPENDENT_AMBULATORY_CARE_PROVIDER_SITE_OTHER): Payer: Medicaid Other | Admitting: Family Medicine

## 2017-04-18 VITALS — BP 120/66 | HR 84 | Temp 98.2°F | Resp 16 | Ht 63.0 in | Wt 227.0 lb

## 2017-04-18 DIAGNOSIS — R1319 Other dysphagia: Secondary | ICD-10-CM

## 2017-04-18 DIAGNOSIS — F329 Major depressive disorder, single episode, unspecified: Secondary | ICD-10-CM | POA: Diagnosis not present

## 2017-04-18 DIAGNOSIS — M25561 Pain in right knee: Secondary | ICD-10-CM

## 2017-04-18 DIAGNOSIS — K2100 Gastro-esophageal reflux disease with esophagitis, without bleeding: Secondary | ICD-10-CM

## 2017-04-18 DIAGNOSIS — M25562 Pain in left knee: Secondary | ICD-10-CM

## 2017-04-18 DIAGNOSIS — R079 Chest pain, unspecified: Secondary | ICD-10-CM | POA: Diagnosis not present

## 2017-04-18 DIAGNOSIS — F419 Anxiety disorder, unspecified: Secondary | ICD-10-CM

## 2017-04-18 DIAGNOSIS — R1310 Dysphagia, unspecified: Secondary | ICD-10-CM

## 2017-04-18 DIAGNOSIS — G8929 Other chronic pain: Secondary | ICD-10-CM

## 2017-04-18 DIAGNOSIS — F32A Depression, unspecified: Secondary | ICD-10-CM

## 2017-04-18 DIAGNOSIS — R112 Nausea with vomiting, unspecified: Secondary | ICD-10-CM

## 2017-04-18 LAB — POCT URINALYSIS DIP (DEVICE)
Bilirubin Urine: NEGATIVE
GLUCOSE, UA: NEGATIVE mg/dL
Ketones, ur: NEGATIVE mg/dL
LEUKOCYTES UA: NEGATIVE
NITRITE: NEGATIVE
Protein, ur: NEGATIVE mg/dL
UROBILINOGEN UA: 0.2 mg/dL (ref 0.0–1.0)
pH: 5 (ref 5.0–8.0)

## 2017-04-18 MED ORDER — DULERA 100-5 MCG/ACT IN AERO: 2.0000 | INHALATION_SPRAY | Freq: Two times a day (BID) | RESPIRATORY_TRACT | 5 refills | Status: AC

## 2017-04-18 MED ORDER — LORATADINE 10 MG PO TABS: 10.0000 mg | ORAL_TABLET | Freq: Every day | ORAL | 2 refills | Status: AC

## 2017-04-18 MED ORDER — SIMVASTATIN 40 MG PO TABS: 40.0000 mg | ORAL_TABLET | Freq: Every day | ORAL | 3 refills | Status: AC

## 2017-04-18 MED ORDER — RANITIDINE HCL 150 MG PO TABS: 150.0000 mg | ORAL_TABLET | Freq: Two times a day (BID) | ORAL | 5 refills | Status: DC

## 2017-04-18 MED ORDER — NEXIUM 24HR 20 MG PO CPDR: 40.0000 mg | DELAYED_RELEASE_CAPSULE | Freq: Every day | ORAL | 2 refills | Status: DC

## 2017-04-18 MED ORDER — SERTRALINE HCL 100 MG PO TABS: 100.0000 mg | ORAL_TABLET | Freq: Every day | ORAL | 3 refills | Status: DC

## 2017-04-18 MED ORDER — MONTELUKAST SODIUM 10 MG PO TABS: 10.0000 mg | ORAL_TABLET | Freq: Every day | ORAL | 2 refills | Status: AC

## 2017-04-18 MED ORDER — ATORVASTATIN CALCIUM 40 MG PO TABS: 40.0000 mg | ORAL_TABLET | Freq: Every day | ORAL | 2 refills | Status: DC

## 2017-04-18 MED ORDER — FISH OIL 1000 MG PO CAPS: 1000.0000 mg | ORAL_CAPSULE | Freq: Every day | ORAL | 3 refills | Status: AC

## 2017-04-18 MED ORDER — MELOXICAM 15 MG PO TABS: 15.0000 mg | ORAL_TABLET | Freq: Every day | ORAL | 1 refills | Status: DC

## 2017-04-18 NOTE — Patient Instructions (Signed)
I have refilled your medications as requested.  I am referring you to orthopedics, behavioral health for evaluation of depression and anxiety, gastroenterology.

## 2017-04-18 NOTE — Progress Notes (Signed)
Patient ID: Megan [name], female    DOB: 04/08/1966, [age] y.o.   MRN: [id number]  PCP: Bing NeighborsHarris, Albirda Shiel S, FNP  Chief Complaint  Patient presents with  . Establish Care  . Nausea  . Knee Pain    BOTH KNEE  . Medication Refill    Subjective:  HPI Megan [name] is a 51 y.o. female presents to establish care. Her complaints today include chronic nausea, knee pain, and request for medication refill. Medical history includes acid reflux, colitis, fatty liver disease, obesity, diverticulosis, anxiety, hernia repair. She recently relocated here from [place]. She presented to the ED twice recently for evaluation of nausea and chronic knee pain, but left prior to receiving evaluation due to wait time.  Acid Reflux  Today she complains continues intermittent nausea with vomiting. Reports associated chest pain and burning with nausea and is having difficulty swallowing solids and beverages. She has been prescribed omeprazole, however has not taken as her prescription ran out. Uncertain if omeprazole was effective in resolving her reflux symptoms. Megan [name] reports obtaining a colonscopy within the last 12-24 months in [place]. For nausea she has previously been prescribed promethazine which alleviates her symptoms of nausea.   Bilateral Chronic Knee Pain  Knee pain has been present for over 1 month. Feel that the left knee is worst in comparison to the right knee. At times, Megan [name] feels as if her left knee is "giving out on her" to the point of nearly falling. Reports no prior evaluation of her knees with imaging or orthopedic speciality. She has attempted relief with acetaminophen, without considerable relief of symptoms. She has not attempted ice or heat applications.   Anxiety and Depression Megan [name] reports a long history of anxiety and depression. She has been prescribed Zoloft for sometime and feels that her medication may be "wearing off". She denies any thoughts of suicide or harm to  others. Reports that some nights she experiences difficulty with staying asleep. She was referred to psychiatry in the past, however missed her appointment. She would like another referral today.  Social History   Social History  . Marital status: Single    Spouse name: N/A  . Number of children: N/A  . Years of education: N/A   Occupational History  . Not on file.   Social History Main Topics  . Smoking status: Never Smoker  . Smokeless tobacco: Never Used  . Alcohol use No  . Drug use: No  . Sexual activity: Not on file   Other Topics Concern  . Not on file   Social History Narrative  . No narrative on file    Family History  Problem Relation Age of Onset  . Heart attack Father        Required CABG in his 5360's.  . Cancer Father   . Stroke Mother   . Hypertension Mother   . Diabetes Mother   . CAD Sister   . Heart attack Sister    Review of Systems See HPI  Patient Active Problem List   Diagnosis Date Noted  . Anxiety 04/06/2016  . Chest pain 03/31/2016  . Left shoulder pain     Allergies  Allergen Reactions  . Sulfa Antibiotics Anaphylaxis  . Nsaids Nausea And Vomiting    Prior to Admission medications   Medication Sig Start Date End Date Taking? Authorizing Provider  aspirin EC 81 MG tablet Take 81 mg by mouth daily.   Yes [provider]  DULERA 100-5 MCG/ACT AERO Take 2  puffs by mouth 2 (two) times daily. 01/01/16  Yes [provider]  loratadine (CLARITIN) 10 MG tablet Take 10 mg by mouth daily. 03/06/16  Yes [provider]  montelukast (SINGULAIR) 10 MG tablet Take 10 mg by mouth daily. 03/06/16  Yes [provider]  NEXIUM 24HR 20 MG capsule Take 40 mg by mouth daily. 03/06/16  Yes [provider]  Omega-3 Fatty Acids (FISH OIL) 1000 MG CAPS Take 1,000 mg by mouth daily.    Yes [provider]  promethazine (PHENERGAN) 25 MG tablet Take 25 mg by mouth every 6 (six) hours as needed for nausea or  vomiting.   Yes [provider]  ranitidine (ZANTAC) 150 MG tablet Take 150 mg by mouth 2 (two) times daily. 02/12/16  Yes [provider]  sertraline (ZOLOFT) 100 MG tablet Take 100 mg by mouth daily. 03/06/16  Yes [provider]  simvastatin (ZOCOR) 40 MG tablet Take 40 mg by mouth daily.   Yes [provider]  vitamin E 100 UNIT capsule Take 100 Units by mouth daily.   Yes [provider]  atorvastatin (LIPITOR) 40 MG tablet Take 1 tablet (40 mg total) by mouth daily at 6 PM. Patient not taking: Reported on 03/02/2017 03/31/16   Marquette Saa, MD  meloxicam (MOBIC) 15 MG tablet Take 1 tablet (15 mg total) by mouth daily. Patient not taking: Reported on 04/18/2017 04/06/16   Marquette Saa, MD    Past Medical, Surgical Family and Social History reviewed and updated.    Objective:   Today's Vitals   04/18/17 1450  BP: 120/66  Pulse: 84  Resp: 16  Temp: 98.2 F (36.8 C)  TempSrc: Oral  SpO2: 96%  Weight: 227 lb (103 kg)  Height: 5\' 3"  (1.6 m)    Wt Readings from Last 3 Encounters:  04/18/17 227 lb (103 kg)  04/11/17 220 lb (99.8 kg)  03/01/17 210 lb (95.3 kg)   Physical Exam  Constitutional: She is oriented to person, place, and time. She appears well-developed and well-nourished.  HENT:  Head: Normocephalic and atraumatic.  Eyes: Pupils are equal, round, and reactive to light. Conjunctivae and EOM are normal.  Neck: Normal range of motion. Neck supple. No thyromegaly present.  Cardiovascular: Normal rate, regular rhythm, normal heart sounds and intact distal pulses.   EKG: negative ischemic changes.  Pulmonary/Chest: Effort normal and breath sounds normal.  Abdominal: Soft. Bowel sounds are normal. She exhibits no distension and no mass. There is no tenderness. There is no rebound and no guarding.  Musculoskeletal: Normal range of motion. She exhibits no edema.       Right knee: She exhibits normal range of  motion, no swelling and no effusion. Tenderness found. MCL and LCL tenderness noted.       Left knee: She exhibits normal range of motion, no swelling, no effusion, no erythema, normal meniscus and no MCL laxity. Tenderness found. MCL and LCL tenderness noted.  Neurological: She is alert and oriented to person, place, and time.  Skin: Skin is warm and dry.  Psychiatric: Her speech is normal and behavior is normal. Judgment and thought content normal. Her mood appears anxious.   Assessment & Plan:  1. Chronic pain of both knees, obtaining images to identify source of knee pain. Patient could likely benefit from joint injections, therefore will refer to orthopedics surgery for further evaluation. -Ambulatory referral orthopedic surgery  - DG Knee Complete 4 Views Left; Future - DG Knee  Complete 4 Views Right; Future -Will trial Meloxicam 15 mg once daily as needed for knee pain  2. Chest pain, unspecified type - EKG 12-Lead, NSR-unremarkable. Symptoms likely related to reflux symptoms.  3. Intractable vomiting with nausea, unspecified vomiting type -Continue promethazine   4. Gastroesophageal reflux disease with esophagitis, uncontrolled -Will trial ranitidine 150 mg twice daily as patient needs immediate relief of symptoms. After symptoms subside, will bridge to a PPI and d/c ranitidine.  -Ambulatory referral to Gastroenterology  5. Esophageal dysphagia --Ambulatory referral to Gastroenterology  6. Anxiety and depression, symptoms -currently active  -Continue sertraline 100 mg daily  -Ambulatory referral to psychiatry   RTC: 6 weeks for fasting labs and address outstanding health maintenance.  Godfrey Pick. Tiburcio Pea, MSN, FNP-C [facility]  [address] [place], [place] [address] [phone]

## 2017-04-20 ENCOUNTER — Telehealth: Payer: Self-pay

## 2017-04-20 MED ORDER — PROMETHAZINE HCL 25 MG PO TABS: 25.0000 mg | ORAL_TABLET | Freq: Four times a day (QID) | ORAL | 2 refills | Status: AC | PRN

## 2017-04-20 NOTE — Telephone Encounter (Signed)
Phenergan sent to [facility]

## 2017-04-25 ENCOUNTER — Encounter: Payer: Self-pay | Admitting: Gastroenterology

## 2017-05-03 ENCOUNTER — Ambulatory Visit (HOSPITAL_COMMUNITY)
Admission: RE | Admit: 2017-05-03 | Discharge: 2017-05-03 | Disposition: A | Discharge status: Home / Self Care | Payer: Medicaid Other | Source: Ambulatory Visit | Attending: Family Medicine | Admitting: Family Medicine

## 2017-05-03 ENCOUNTER — Encounter (INDEPENDENT_AMBULATORY_CARE_PROVIDER_SITE_OTHER): Payer: Self-pay | Admitting: Orthopaedic Surgery

## 2017-05-03 ENCOUNTER — Ambulatory Visit (INDEPENDENT_AMBULATORY_CARE_PROVIDER_SITE_OTHER): Payer: Medicaid Other | Admitting: Orthopaedic Surgery

## 2017-05-03 DIAGNOSIS — G8929 Other chronic pain: Secondary | ICD-10-CM | POA: Insufficient documentation

## 2017-05-03 DIAGNOSIS — M25562 Pain in left knee: Secondary | ICD-10-CM | POA: Insufficient documentation

## 2017-05-03 DIAGNOSIS — M1711 Unilateral primary osteoarthritis, right knee: Secondary | ICD-10-CM | POA: Diagnosis not present

## 2017-05-03 DIAGNOSIS — M25561 Pain in right knee: Secondary | ICD-10-CM | POA: Diagnosis present

## 2017-05-03 DIAGNOSIS — M1712 Unilateral primary osteoarthritis, left knee: Secondary | ICD-10-CM | POA: Diagnosis not present

## 2017-05-03 MED ORDER — TRAMADOL HCL 50 MG PO TABS: 50.0000 mg | ORAL_TABLET | Freq: Four times a day (QID) | ORAL | 0 refills | Status: DC | PRN

## 2017-05-03 MED ORDER — BUPIVACAINE HCL 0.5 % IJ SOLN
2.0000 mL | INTRAMUSCULAR | Status: AC | PRN
  Administered 2017-05-03: 2 mL via INTRA_ARTICULAR

## 2017-05-03 MED ORDER — METHYLPREDNISOLONE ACETATE 40 MG/ML IJ SUSP
40.0000 mg | INTRAMUSCULAR | Status: AC | PRN
  Administered 2017-05-03: 40 mg via INTRA_ARTICULAR

## 2017-05-03 MED ORDER — LIDOCAINE HCL 1 % IJ SOLN
2.0000 mL | INTRAMUSCULAR | Status: AC | PRN
  Administered 2017-05-03: 2 mL

## 2017-05-03 NOTE — Progress Notes (Signed)
Office Visit Note   Patient: Megan [name]           Date of Birth: 10/22/1965           MRN: [id number] Visit Date: 05/03/2017              Requested by: Bing NeighborsHarris, Kimberly S, FNP [address] [place], [place] [id number] PCP: Bing NeighborsHarris, Kimberly S, FNP   Assessment & Plan: Visit Diagnoses:  1. Unilateral primary osteoarthritis, left knee   2. Unilateral primary osteoarthritis, right knee     Plan: Overall impression is bilateral knee osteoarthritis. Bilateral knee injections were performed today. Patient cannot take Celebrex given allergy to sulfa. Questions encouraged and answered. Discussed the importance of physical therapy, quadriceps strengthening, weight loss. Follow-up as needed.  Follow-Up Instructions: Return if symptoms worsen or fail to improve.   Orders:  No orders of the defined types were placed in this encounter.  Meds ordered this encounter  Medications  . traMADol (ULTRAM) 50 MG tablet    Sig: Take 1 tablet (50 mg total) by mouth every 6 (six) hours as needed.    Dispense:  30 tablet    Refill:  0      Procedures: Large Joint Inj Date/Time: 05/03/2017 8:32 PM Performed by: Tarry KosXU, NAIPING M Authorized by: Tarry KosXU, NAIPING M   Consent Given by:  Patient Timeout: prior to procedure the correct patient, procedure, and site was verified   Indications:  Pain Location:  Knee Site:  R knee Prep: patient was prepped and draped in usual sterile fashion   Needle Size:  22 G Ultrasound Guidance: No   Fluoroscopic Guidance: No   Arthrogram: No   Patient tolerance:  Patient tolerated the procedure well with no immediate complications Large Joint Inj Date/Time: 05/03/2017 8:32 PM Performed by: Tarry KosXU, NAIPING M Authorized by: Tarry KosXU, NAIPING M   Consent Given by:  Patient Timeout: prior to procedure the correct patient, procedure, and site was verified   Indications:  Pain Location:  Knee Site:  L knee Prep: patient was prepped and draped in usual sterile fashion   Needle Size:   22 G Ultrasound Guidance: No   Fluoroscopic Guidance: No   Arthrogram: No   Medications:  2 mL lidocaine 1 %; 2 mL bupivacaine 0.5 %; 40 mg methylPREDNISolone acetate 40 MG/ML Patient tolerance:  Patient tolerated the procedure well with no immediate complications     Clinical Data: No additional findings.   Subjective: Chief Complaint  Patient presents with  . Right Knee - Pain  . Left Knee - Pain    Patient is a 51 year old female who comes in with bilateral knee pain for a month that is getting worse. Ibuprofen and Mobic take the edge off. She feels the knees will give out patient. She denies any swelling. NSAIDs have given her GI upset. Denies any numbness and tingling or injuries.    Review of Systems  Constitutional: Negative.   HENT: Negative.   Eyes: Negative.   Respiratory: Negative.   Cardiovascular: Negative.   Endocrine: Negative.   Musculoskeletal: Negative.   Neurological: Negative.   Hematological: Negative.   Psychiatric/Behavioral: Negative.   All other systems reviewed and are negative.    Objective: Vital Signs: There were no vitals taken for this visit.  Physical Exam  Constitutional: She is oriented to person, place, and time. She appears well-developed and well-nourished.  HENT:  Head: Normocephalic and atraumatic.  Eyes: EOM are normal.  Neck: Neck supple.  Pulmonary/Chest: Effort  normal.  Abdominal: Soft.  Neurological: She is alert and oriented to person, place, and time.  Skin: Skin is warm. Capillary refill takes less than 2 seconds.  Psychiatric: She has a normal mood and affect. Her behavior is normal. Judgment and thought content normal.  Nursing note and vitals reviewed.   Ortho Exam Bilateral knee exam shows no joint effusion. Collaterals and cruciates are stable. Normal range of motion. Positive patellar crepitus. Specialty Comments:  No specialty comments available.  Imaging: Dg Knee Complete 4 Views Left  Result  Date: 05/03/2017 CLINICAL DATA:  Bilateral knee pain for 1 month. EXAM: LEFT KNEE - COMPLETE 4+ VIEW COMPARISON:  Right knee radiographs of the same day. FINDINGS: No evidence of fracture, dislocation, or joint effusion. No evidence of arthropathy or other focal bone abnormality. Soft tissues are unremarkable. IMPRESSION: Negative left knee radiographs. Electronically Signed   By: Marin Roberts M.D.   On: 05/03/2017 15:20   Dg Knee Complete 4 Views Right  Result Date: 05/03/2017 CLINICAL DATA:  One month history of pain EXAM: RIGHT KNEE - COMPLETE 4+ VIEW COMPARISON:  None. FINDINGS: Frontal, lateral, and bilateral oblique views were obtained. No fracture or dislocation. No joint effusion. The joint spaces appear unremarkable. No erosive change. IMPRESSION: No fracture or dislocation. No joint effusion. No appreciable arthropathy. Electronically Signed   By: Bretta Bang III M.D.   On: 05/03/2017 15:23     PMFS History: Patient Active Problem List   Diagnosis Date Noted  . Unilateral primary osteoarthritis, left knee 05/03/2017  . Unilateral primary osteoarthritis, right knee 05/03/2017  . Anxiety 04/06/2016  . Chest pain 03/31/2016  . Left shoulder pain    Past Medical History:  Diagnosis Date  . Anxiety   . Asthma   . Chiari malformation type I (HCC)   . Colitis   . Fatty liver   . GERD (gastroesophageal reflux disease)     Family History  Problem Relation Age of Onset  . Heart attack Father        Required CABG in his 41's.  . Cancer Father   . Stroke Mother   . Hypertension Mother   . Diabetes Mother   . CAD Sister   . Heart attack Sister     Past Surgical History:  Procedure Laterality Date  . ABDOMINAL HYSTERECTOMY    . BREAST LUMPECTOMY Right   . CARPAL TUNNEL RELEASE Left   . CESAREAN SECTION    . CHOLECYSTECTOMY    . SHOULDER SURGERY Left    Social History   Occupational History  . Not on file.   Social History Main Topics  . Smoking status:  Never Smoker  . Smokeless tobacco: Never Used  . Alcohol use No  . Drug use: No  . Sexual activity: Not on file

## 2017-05-06 ENCOUNTER — Ambulatory Visit: Payer: Medicaid Other | Admitting: Gastroenterology

## 2017-05-25 ENCOUNTER — Ambulatory Visit: Payer: Medicaid Other | Admitting: Gastroenterology

## 2017-05-31 ENCOUNTER — Encounter: Payer: Self-pay | Admitting: Family Medicine

## 2017-05-31 ENCOUNTER — Ambulatory Visit (INDEPENDENT_AMBULATORY_CARE_PROVIDER_SITE_OTHER): Payer: Medicaid Other | Admitting: Family Medicine

## 2017-05-31 VITALS — BP 117/64 | HR 76 | Temp 98.7°F | Resp 14 | Ht 63.0 in | Wt 227.0 lb

## 2017-05-31 DIAGNOSIS — G8929 Other chronic pain: Secondary | ICD-10-CM

## 2017-05-31 DIAGNOSIS — Z23 Encounter for immunization: Secondary | ICD-10-CM

## 2017-05-31 DIAGNOSIS — F419 Anxiety disorder, unspecified: Secondary | ICD-10-CM | POA: Diagnosis not present

## 2017-05-31 DIAGNOSIS — M25561 Pain in right knee: Secondary | ICD-10-CM

## 2017-05-31 DIAGNOSIS — Z1239 Encounter for other screening for malignant neoplasm of breast: Secondary | ICD-10-CM

## 2017-05-31 DIAGNOSIS — Z1231 Encounter for screening mammogram for malignant neoplasm of breast: Secondary | ICD-10-CM

## 2017-05-31 DIAGNOSIS — R7303 Prediabetes: Secondary | ICD-10-CM | POA: Diagnosis not present

## 2017-05-31 DIAGNOSIS — F329 Major depressive disorder, single episode, unspecified: Secondary | ICD-10-CM | POA: Diagnosis not present

## 2017-05-31 DIAGNOSIS — M25562 Pain in left knee: Secondary | ICD-10-CM

## 2017-05-31 DIAGNOSIS — R300 Dysuria: Secondary | ICD-10-CM | POA: Diagnosis not present

## 2017-05-31 LAB — POCT URINALYSIS DIP (DEVICE)
Bilirubin Urine: NEGATIVE
Glucose, UA: NEGATIVE mg/dL
HGB URINE DIPSTICK: NEGATIVE
Ketones, ur: NEGATIVE mg/dL
Leukocytes, UA: NEGATIVE
Nitrite: NEGATIVE
PH: 5.5 (ref 5.0–8.0)
PROTEIN: NEGATIVE mg/dL
UROBILINOGEN UA: 0.2 mg/dL (ref 0.0–1.0)

## 2017-05-31 LAB — POCT GLYCOSYLATED HEMOGLOBIN (HGB A1C): Hemoglobin A1C: 6

## 2017-05-31 MED ORDER — PHENAZOPYRIDINE HCL 200 MG PO TABS: 200.0000 mg | ORAL_TABLET | Freq: Three times a day (TID) | ORAL | 0 refills | Status: DC | PRN

## 2017-05-31 MED ORDER — GABAPENTIN 300 MG PO CAPS: 300.0000 mg | ORAL_CAPSULE | Freq: Three times a day (TID) | ORAL | 3 refills | Status: AC

## 2017-05-31 NOTE — Patient Instructions (Addendum)
For numbness in arm, leg, and foot pain, start gabapentin 300 mg, 3 times daily.  Follow-up with orthopedic office, Dr. Roda Shutters regarding no improvement of knee pain with the treatment administered.  Your A1C 6.0 which is prediabetes. Increase physical activity and reduce intake of refine sugars and starchy foods.  Follow-up with psychiatric services to schedule counseling appointment.  I have ordered your screening mammogram. To schedule your breast exam, please call [facility] at   Phone: [phone]  I am prescribing pyridine for urine pain.  Neuropathic Pain Neuropathic pain is pain caused by damage to the nerves that are responsible for certain sensations in your body (sensory nerves). The pain can be caused by damage to:  The sensory nerves that send signals to your spinal cord and brain (peripheral nervous system).  The sensory nerves in your brain or spinal cord (central nervous system).  Neuropathic pain can make you more sensitive to pain. What would be a minor sensation for most people may feel very painful if you have neuropathic pain. This is usually a long-term condition that can be difficult to treat. The type of pain can differ from person to person. It may start suddenly (acute), or it may develop slowly and last for a long time (chronic). Neuropathic pain may come and go as damaged nerves heal or may stay at the same level for years. It often causes emotional distress, loss of sleep, and a lower quality of life. What are the causes? The most common cause of damage to a sensory nerve is diabetes. Many other diseases and conditions can also cause neuropathic pain. Causes of neuropathic pain can be classified as:  Toxic. Many drugs and chemicals can cause toxic damage. The most common cause of toxic neuropathic pain is damage from drug treatment for cancer (chemotherapy).  Metabolic. This type of pain can happen when a disease causes imbalances that damage  nerves. Diabetes is the most common of these diseases. Vitamin B deficiency caused by long-term alcohol abuse is another common cause.  Traumatic. Any injury that cuts, crushes, or stretches a nerve can cause damage and pain. A common example is feeling pain after losing an arm or leg (phantom limb pain).  Compression-related. If a sensory nerve gets trapped or compressed for a long period of time, the blood supply to the nerve can be cut off.  Vascular. Many blood vessel diseases can cause neuropathic pain by decreasing blood supply and oxygen to nerves.  Autoimmune. This type of pain results from diseases in which the body's defense system mistakenly attacks sensory nerves. Examples of autoimmune diseases that can cause neuropathic pain include lupus and multiple sclerosis.  Infectious. Many types of viral infections can damage sensory nerves and cause pain. Shingles infection is a common cause of this type of pain.  Inherited. Neuropathic pain can be a symptom of many diseases that are passed down through families (genetic).  What are the signs or symptoms? The main symptom is pain. Neuropathic pain is often described as:  Burning.  Shock-like.  Stinging.  Hot or cold.  Itching.  How is this diagnosed? No single test can diagnose neuropathic pain. Your health care provider will do a physical exam and ask you about your pain. You may use a pain scale to describe how bad your pain is. You may also have tests to see if you have a high sensitivity to pain and to help find the cause and location of any sensory nerve damage. These  tests may include:  Imaging studies, such as: ? X-rays. ? CT scan. ? MRI.  Nerve conduction studies to test how well nerve signals travel through your sensory nerves (electrodiagnostic testing).  Stimulating your sensory nerves through electrodes on your skin and measuring the response in your spinal cord and brain (somatosensory evoked potentials).  How  is this treated? Treatment for neuropathic pain may change over time. You may need to try different treatment options or a combination of treatments. Some options include:  Over-the-counter pain relievers.  Prescription medicines. Some medicines used to treat other conditions may also help neuropathic pain. These include medicines to: ? Control seizures (anticonvulsants). ? Relieve depression (antidepressants).  Prescription-strength pain relievers (narcotics). These are usually used when other pain relievers do not help.  Transcutaneous nerve stimulation (TENS). This uses electrical currents to block painful nerve signals. The treatment is painless.  Topical and local anesthetics. These are medicines that numb the nerves. They can be injected as a nerve block or applied to the skin.  Alternative treatments, such as: ? Acupuncture. ? Meditation. ? Massage. ? Physical therapy. ? Pain management programs. ? Counseling.  Follow these instructions at home:  Learn as much as you can about your condition.  Take medicines only as directed by your health care provider.  Work closely with all your health care providers to find what works best for you.  Have a good support system at home.  Consider joining a chronic pain support group. Contact a health care provider if:  Your pain treatments are not helping.  You are having side effects from your medicines.  You are struggling with fatigue, mood changes, depression, or anxiety. This information is not intended to replace advice given to you by your health care provider. Make sure you discuss any questions you have with your health care provider. Document Released: 06/10/2004 Document Revised: 04/02/2016 Document Reviewed: 02/21/2014 Elsevier Interactive Patient Education  2018 [employer]    Prediabetes Prediabetes is the condition of having a blood sugar (blood glucose) level that is higher than it should be, but not high  enough for you to be diagnosed with type 2 diabetes. Having prediabetes puts you at risk for developing type 2 diabetes (type 2 diabetes mellitus). Prediabetes may be called impaired glucose tolerance or impaired fasting glucose. Prediabetes usually does not cause symptoms. Your health care provider can diagnose this condition with blood tests. You may be tested for prediabetes if you are overweight and if you have at least one other risk factor for prediabetes. Risk factors for prediabetes include:  Having a family member with type 2 diabetes.  Being overweight or obese.  Being older than age 51.  Being of American-Indian, African-American, Hispanic/Latino, or Asian/Pacific Islander descent.  Having an inactive (sedentary) lifestyle.  Having a history of gestational diabetes or polycystic ovarian syndrome (PCOS).  Having low levels of good cholesterol (HDL-C) or high levels of blood fats (triglycerides).  Having high blood pressure.  What is blood glucose and how is blood glucose measured?  Blood glucose refers to the amount of glucose in your bloodstream. Glucose comes from eating foods that contain sugars and starches (carbohydrates) that the body breaks down into glucose. Your blood glucose level may be measured in mg/dL (milligrams per deciliter) or mmol/L (millimoles per liter).Your blood glucose may be checked with one or more of the following blood tests:  A fasting blood glucose (FBG) test. You will not be allowed to eat (you will fast) for at least  8 hours before a blood sample is taken. ? A normal range for FBG is 70-100 mg/dl (6.9-6.2 mmol/L).  An A1c (hemoglobin A1c) blood test. This test provides information about blood glucose control over the previous 2?3months.  An oral glucose tolerance test (OGTT). This test measures your blood glucose twice: ? After fasting. This is your baseline level. ? Two hours after you drink a beverage that contains glucose.  You may be  diagnosed with prediabetes:  If your FBG is 100?125 mg/dL (9.5-2.8 mmol/L).  If your A1c level is 5.7?6.4%.  If your OGGT result is 140?199 mg/dL (4.1-32 mmol/L).  These blood tests may be repeated to confirm your diagnosis. What happens if blood glucose is too high? The pancreas produces a hormone (insulin) that helps move glucose from the bloodstream into cells. When cells in the body do not respond properly to insulin that the body makes (insulin resistance), excess glucose builds up in the blood instead of going into cells. As a result, high blood glucose (hyperglycemia) can develop, which can cause many complications. This is a symptom of prediabetes. What can happen if blood glucose stays higher than normal for a long time? Having high blood glucose for a long time is dangerous. Too much glucose in your blood can damage your nerves and blood vessels. Long-term damage can lead to complications from diabetes, which may include:  Heart disease.  Stroke.  Blindness.  Kidney disease.  Depression.  Poor circulation in the feet and legs, which could lead to surgical removal (amputation) in severe cases.  How can prediabetes be prevented from turning into type 2 diabetes?  To help prevent type 2 diabetes, take the following actions:  Be physically active. ? Do moderate-intensity physical activity for at least 30 minutes on at least 5 days of the week, or as much as told by your health care provider. This could be brisk walking, biking, or water aerobics. ? Ask your health care provider what activities are safe for you. A mix of physical activities may be best, such as walking, swimming, cycling, and strength training.  Lose weight as told by your health care provider. ? Losing 5-7% of your body weight can reverse insulin resistance. ? Your health care provider can determine how much weight loss is best for you and can help you lose weight safely.  Follow a healthy meal plan. This  includes eating lean proteins, complex carbohydrates, fresh fruits and vegetables, low-fat dairy products, and healthy fats. ? Follow instructions from your health care provider about eating or drinking restrictions. ? Make an appointment to see a diet and nutrition specialist (registered dietitian) to help you create a healthy eating plan that is right for you.  Do not smoke or use any tobacco products, such as cigarettes, chewing tobacco, and e-cigarettes. If you need help quitting, ask your health care provider.  Take over-the-counter and prescription medicines as told by your health care provider. You may be prescribed medicines that help lower the risk of type 2 diabetes.  This information is not intended to replace advice given to you by your health care provider. Make sure you discuss any questions you have with your health care provider. Document Released: 01/05/2016 Document Revised: 02/19/2016 Document Reviewed: 11/04/2015 Elsevier Interactive Patient Education  [employer].

## 2017-05-31 NOTE — Progress Notes (Signed)
Patient ID: Megan [name], female    DOB: 02/12/1966, 51 y.o.   MRN: [id number]  PCP: Megan [name]  Chief Complaint  Patient presents with  . Follow-up    6 WEEKSF    Subjective:  HPI Megan [name] is a 51 y.o. female presents 6 week follow for evaluation of knee pain, anxiety/depression, and address any outstanding health maintenance. Megan [name] reports having a mammogram over 1 year prior which was normal. Denies breast pain, family/personal history breast cancer. During prior office visit, Megan [name] reports worsening depressive symptoms and [facility] eferred to behavorial for further evaluation. She reports receiving a call, however hasn't attempted to call back to scheduled an appointment. Denies active thoughts of suicide or harming others. She feels symptoms are mostly due to recent life change in which she now has full custody of grandson who is 51 years old and she is starting her life all over. Megan [name] suffers from prediabetes, last A1C 5.8 and she desires to walk and participate in water aerobics once her knees are evaluated as she suffers from chronic knee and generalized pain. Megan [name] also complains of dysuria x 3 days in the absence of urine odor, abdominal, or back pain. Social History   Social History  . Marital status: Single    Spouse name: N/A  . Number of children: N/A  . Years of education: N/A   Occupational History  . Not on file.   Social History Main Topics  . Smoking status: Never Smoker  . Smokeless tobacco: Never Used  . Alcohol use No  . Drug use: No  . Sexual activity: Not on file   Other Topics Concern  . Not on file   Social History Narrative  . No narrative on file    Family History  Problem Relation Age of Onset  . Heart attack Father        Required CABG in his 6060's.  . Cancer Father   . Stroke Mother   . Hypertension Mother   . Diabetes Mother   . CAD Sister   . Heart attack Sister    Review of Systems See HPI   Patient Active Problem List   Diagnosis Date Noted  . Unilateral primary osteoarthritis, left knee 05/03/2017  . Unilateral primary osteoarthritis, right knee 05/03/2017  . Anxiety 04/06/2016  . Chest pain 03/31/2016  . Left shoulder pain     Allergies  Allergen Reactions  . Sulfa Antibiotics Anaphylaxis  . Nsaids Nausea And Vomiting    Prior to Admission medications   Medication Sig Start Date End Date Taking? Authorizing Provider  aspirin EC 81 MG tablet Take 81 mg by mouth daily.   Yes [provider]  DULERA 100-5 MCG/ACT AERO Inhale 2 puffs into the lungs 2 (two) times daily. 04/18/17  Yes Megan NeighborsHarris, Samie Barclift S, [name]  loratadine (CLARITIN) 10 MG tablet Take 1 tablet (10 mg total) by mouth daily. 04/18/17  Yes Megan NeighborsHarris, Gerry Heaphy S, [name]  meloxicam (MOBIC) 15 MG tablet Take 1 tablet (15 mg total) by mouth daily. 04/18/17  Yes Megan NeighborsHarris, Maniah Nading S, [name]  montelukast (SINGULAIR) 10 MG tablet Take 1 tablet (10 mg total) by mouth daily. 04/18/17  Yes Megan NeighborsHarris, Avayah Raffety S, [name]  Omega-3 Fatty Acids (FISH OIL) 1000 MG CAPS Take 1 capsule (1,000 mg total) by mouth daily. 04/18/17  Yes Megan NeighborsHarris, Alroy Portela S, [name]  promethazine (PHENERGAN) 25 MG tablet Take 1 tablet (25 mg total) by mouth every 6 (six) hours as needed for  nausea or vomiting. 04/20/17  Yes Megan Neighbors, [name]  ranitidine (ZANTAC) 150 MG tablet Take 1 tablet (150 mg total) by mouth 2 (two) times daily. 04/18/17  Yes Megan Neighbors, [name]  sertraline (ZOLOFT) 100 MG tablet Take 1 tablet (100 mg total) by mouth daily. 04/18/17  Yes Megan Neighbors, [name]  simvastatin (ZOCOR) 40 MG tablet Take 1 tablet (40 mg total) by mouth daily. 04/18/17  Yes Megan Neighbors, [name]  traMADol (ULTRAM) 50 MG tablet Take 1 tablet (50 mg total) by mouth every 6 (six) hours as needed. 05/03/17  Yes Tarry Kos, MD  vitamin E 100 UNIT capsule Take 100 Units by mouth daily.   Yes [provider]    Past Medical, Surgical Family and Social History  reviewed and updated.    Objective:   Today's Vitals   05/31/17 1352  BP: 117/64  Pulse: 76  Resp: 14  Temp: 98.7 F (37.1 C)  TempSrc: Oral  SpO2: 96%  Weight: 227 lb (103 kg)  Height: 5\' 3"  (1.6 m)    Wt Readings from Last 3 Encounters:  05/31/17 227 lb (103 kg)  04/18/17 227 lb (103 kg)  04/11/17 220 lb (99.8 kg)    Physical Exam  Constitutional: She is oriented to person, place, and time. She appears well-developed and well-nourished.  HENT:  Head: Normocephalic and atraumatic.  Eyes: Pupils are equal, round, and reactive to light. Conjunctivae and EOM are normal.  Neck: Normal range of motion. Neck supple.  Cardiovascular: Normal rate, regular rhythm, normal heart sounds and intact distal pulses.   Pulmonary/Chest: Effort normal and breath sounds normal.  Musculoskeletal: Normal range of motion.  Neurological: She is alert and oriented to person, place, and time.  Skin: Skin is warm and dry.  Psychiatric: She has a normal mood and affect. Her behavior is normal. Judgment and thought content normal.   Assessment & Plan:  1. Prediabetes - POCT glycosylated hemoglobin (Hb A1C), 6.0, increased from prior 5.8 Increase physical activity (water aerobics is a great alterative) and reduce intake of starchy foods.    2. Dysuria - Urine Culture -UA negative, treat symptomatically for now with phenazopyridine (Pyridium)   3. Screening for breast cancer - MM Digital Screening; Future  4. Chronic pain of both knees -Meloxicam 15 mg once daily  -Continue follow-up with Dr. Roda Shutters -Continue Gabapentin 300 mg, TID  5. Anxiety and depression -Keep follow-up with [facility] -Continue Zoloft 100 mg once daily -Continue Gabapentin 300 mg, TID  6. Need for influenza vaccination - Flu Vaccine QUAD 36+ mos IM  RTC:  6 months repeat A1C.   Megan Pick. Tiburcio Pea, MSN, [name]-C [facility]  [address] [place] [place], [place] [address] [phone]

## 2017-06-01 LAB — URINE CULTURE: Organism ID, Bacteria: NO GROWTH

## 2017-07-01 ENCOUNTER — Other Ambulatory Visit: Payer: Medicaid Other

## 2017-07-19 ENCOUNTER — Encounter: Payer: Self-pay | Admitting: Gastroenterology

## 2017-07-19 ENCOUNTER — Other Ambulatory Visit: Payer: Medicaid Other

## 2017-07-19 ENCOUNTER — Ambulatory Visit (INDEPENDENT_AMBULATORY_CARE_PROVIDER_SITE_OTHER): Payer: Medicaid Other | Admitting: Gastroenterology

## 2017-07-19 ENCOUNTER — Encounter (INDEPENDENT_AMBULATORY_CARE_PROVIDER_SITE_OTHER): Payer: Self-pay

## 2017-07-19 VITALS — BP 112/80 | HR 82 | Ht 63.0 in | Wt 225.0 lb

## 2017-07-19 DIAGNOSIS — R112 Nausea with vomiting, unspecified: Secondary | ICD-10-CM

## 2017-07-19 DIAGNOSIS — R1310 Dysphagia, unspecified: Secondary | ICD-10-CM

## 2017-07-19 NOTE — Progress Notes (Signed)
HPI: This is a very pleasant 51 year old woman who was referred to me by Bing NeighborsHarris, Kimberly S, FNP  to evaluate nausea, intermittent vomiting, dysphagia.    Chief complaint is nausea, vomiting, dysphasia  Debroah Looprnold Chiari syndrome, type I since birth  2 months of dysphagia, solid and liquid, getting worse.  Drink water to get it down  Weight gain lately (50 pounds in about year).  She gets pyrosis chronically.  Although not on her med list below she does take omeprazole 40 mg every morning after breakfast.  She takes Zantac at bedtime every night.  Previously on nexium and zantac.  Switched to omeprazole, usually in AM after breakfast.  Zantac at bedtime.  Has chronic nausea.  Intermittent.  For years.   No particular foods play a role.  Takes phenergan fairly regularly to help with her nausea.  This sedates her a bit.  Vomits periodically.   No NSAIDs.    Old Data Reviewed:     Review of systems: Pertinent positive and negative review of systems were noted in the above HPI section. All other review negative.   Past Medical History:  Diagnosis Date  . Anxiety   . Arnold-Chiari malformation, type I (HCC)   . Asthma   . Chiari malformation type I (HCC)   . Colitis   . Depression   . Fatty liver   . GERD (gastroesophageal reflux disease)   . Kidney stones   . Sleep apnea   . UTI (urinary tract infection)     Past Surgical History:  Procedure Laterality Date  . ABDOMINAL HYSTERECTOMY    . BREAST LUMPECTOMY Right   . CARPAL TUNNEL RELEASE Left   . CESAREAN SECTION    . CHOLECYSTECTOMY    . SHOULDER SURGERY Left     Current Outpatient Prescriptions  Medication Sig Dispense Refill  . aspirin EC 81 MG tablet Take 81 mg by mouth daily.    . DULERA 100-5 MCG/ACT AERO Inhale 2 puffs into the lungs 2 (two) times daily. 13 g 5  . gabapentin (NEURONTIN) 300 MG capsule Take 1 capsule (300 mg total) by mouth 3 (three) times daily. 90 capsule 3  . loratadine (CLARITIN) 10 MG  tablet Take 1 tablet (10 mg total) by mouth daily. 90 tablet 2  . montelukast (SINGULAIR) 10 MG tablet Take 1 tablet (10 mg total) by mouth daily. 90 tablet 2  . Omega-3 Fatty Acids (FISH OIL) 1000 MG CAPS Take 1 capsule (1,000 mg total) by mouth daily. 90 capsule 3  . promethazine (PHENERGAN) 25 MG tablet Take 1 tablet (25 mg total) by mouth every 6 (six) hours as needed for nausea or vomiting. 30 tablet 2  . ranitidine (ZANTAC) 150 MG tablet Take 1 tablet (150 mg total) by mouth 2 (two) times daily. 60 tablet 5  . sertraline (ZOLOFT) 100 MG tablet Take 1 tablet (100 mg total) by mouth daily. 90 tablet 3  . simvastatin (ZOCOR) 40 MG tablet Take 1 tablet (40 mg total) by mouth daily. 90 tablet 3  . vitamin E 100 UNIT capsule Take 100 Units by mouth daily.     No current facility-administered medications for this visit.     Allergies as of 07/19/2017 - Review Complete 07/19/2017  Allergen Reaction Noted  . Sulfa antibiotics Anaphylaxis 11/21/2015  . Nsaids Nausea And Vomiting 11/21/2015    Family History  Problem Relation Age of Onset  . Heart attack Father        Required CABG in his  60's.  . Prostate cancer Father   . Stroke Mother   . Hypertension Mother   . Diabetes Mother   . CAD Sister   . Heart attack Sister     Social History   Social History  . Marital status: Divorced    Spouse name: N/A  . Number of children: 3  . Years of education: N/A   Occupational History  . disabled    Social History Main Topics  . Smoking status: Never Smoker  . Smokeless tobacco: Never Used  . Alcohol use No  . Drug use: No  . Sexual activity: Not on file   Other Topics Concern  . Not on file   Social History Narrative  . No narrative on file     Physical Exam: BP 112/80   Pulse 82   Ht 5\' 3"  (1.6 m)   Wt 225 lb (102.1 kg)   BMI 39.86 kg/m  Constitutional: generally well-appearing Psychiatric: alert and oriented x3 Eyes: extraocular movements intact Mouth: oral  pharynx moist, no lesions; no upper teeth Neck: supple no lymphadenopathy Cardiovascular: heart regular rate and rhythm Lungs: clear to auscultation bilaterally Abdomen: soft, nontender, nondistended, no obvious ascites, no peritoneal signs, normal bowel sounds Extremities: no lower extremity edema bilaterally Skin: no lesions on visible extremities   Assessment and plan: 51 y.o. female with nausea, vomiting, intermittent dysphasia  First she has gained about 50 pounds in the past year and so I think it is unlikely there is anything serious like a neoplasm going on here.  She might have peptic ulcer disease, H. pylori infection.  These could account for her nausea and vomiting.  Such extreme weight gain might also cause some nausea and vomiting I suspect.  She has dysphasia over the past 2 months.  Having no upper teeth probably plays a role.  She may also have some peptic stricturing from her acid symptoms.  I recommended we proceed with a EGD at her soonest convenience and I would plan to dilate her esophagus if the stricture is noted.  She does not take her proton pump inhibitor at the current time in relation to meals so I will have her change that.  She will continue taking Zantac at bedtime every night.    Please see the "Patient Instructions" section for addition details about the plan.   Rob Bunting, MD [facility] 07/19/2017, 3:02 PM  Cc: Bing Neighbors, FNP

## 2017-07-19 NOTE — Patient Instructions (Addendum)
You should change the way you are taking your antiacid medicine (omeprazole) so that you are taking it 20-30 minutes prior to a decent meal as that is the way the pill is designed to work most effectively. Change your ranitidine so that you take it at bedtime every night. You will be set up for an upper endoscopy with dilation for dysphagia.    You have been scheduled for an endoscopy. Please follow written instructions given to you at your visit today. If you use inhalers (even only as needed), please bring them with you on the day of your procedure. Your physician has requested that you go to www.startemmi.com and enter the access code given to you at your visit today. This web site gives a general overview about your procedure. However, you should still follow specific instructions given to you by our office regarding your preparation for the procedure.

## 2017-07-21 ENCOUNTER — Telehealth: Payer: Self-pay | Admitting: Gastroenterology

## 2017-07-25 ENCOUNTER — Encounter: Payer: Medicaid Other | Admitting: Gastroenterology

## 2017-08-03 ENCOUNTER — Telehealth: Payer: Self-pay

## 2017-08-04 MED ORDER — ALBUTEROL SULFATE (2.5 MG/3ML) 0.083% IN NEBU: 2.5000 mg | INHALATION_SOLUTION | Freq: Four times a day (QID) | RESPIRATORY_TRACT | 1 refills | Status: AC | PRN

## 2017-08-04 MED ORDER — SERTRALINE HCL 100 MG PO TABS: 100.0000 mg | ORAL_TABLET | Freq: Every day | ORAL | 3 refills | Status: DC

## 2017-08-04 NOTE — Telephone Encounter (Signed)
Zoloft 100 mg once daily and albuterol nebulizer solution [facility] to [facility].  Godfrey PickKimberly S. Tiburcio PeaHarris, MSN, FNP-C [facility]  [address] [place], [place] [id number] [phone]

## 2017-08-05 ENCOUNTER — Other Ambulatory Visit: Payer: Self-pay | Admitting: Family Medicine

## 2017-08-05 MED ORDER — SERTRALINE HCL 100 MG PO TABS: 100.0000 mg | ORAL_TABLET | Freq: Every day | ORAL | 3 refills | Status: DC

## 2017-08-05 NOTE — Progress Notes (Signed)
Reordered Zoloft and [facility]

## 2017-08-10 MED ORDER — SERTRALINE HCL 100 MG PO TABS: 100.0000 mg | ORAL_TABLET | Freq: Every day | ORAL | 3 refills | Status: AC

## 2017-08-10 NOTE — Telephone Encounter (Signed)
Patient needs a refill of the Zoloft 25mg  sent to the [facility].

## 2017-08-10 NOTE — Telephone Encounter (Signed)
Patient notified and will come in for appointment on Friday

## 2017-08-10 NOTE — Telephone Encounter (Signed)
Patient is prescribed 100 mg Zoloft and this was sent on 08/05/2017. I will resend prescription

## 2017-08-12 ENCOUNTER — Ambulatory Visit (INDEPENDENT_AMBULATORY_CARE_PROVIDER_SITE_OTHER): Payer: Medicaid Other | Admitting: Family Medicine

## 2017-08-12 ENCOUNTER — Ambulatory Visit: Payer: Medicaid Other | Admitting: Family Medicine

## 2017-08-12 ENCOUNTER — Encounter: Payer: Self-pay | Admitting: Family Medicine

## 2017-08-12 VITALS — BP 120/65 | HR 92 | Temp 97.8°F | Ht 63.0 in | Wt 227.0 lb

## 2017-08-12 DIAGNOSIS — M79671 Pain in right foot: Secondary | ICD-10-CM | POA: Diagnosis not present

## 2017-08-12 DIAGNOSIS — M7989 Other specified soft tissue disorders: Secondary | ICD-10-CM

## 2017-08-12 DIAGNOSIS — M79672 Pain in left foot: Secondary | ICD-10-CM

## 2017-08-12 DIAGNOSIS — R079 Chest pain, unspecified: Secondary | ICD-10-CM | POA: Diagnosis not present

## 2017-08-12 MED ORDER — PREDNISONE 20 MG PO TABS: 60.0000 mg | ORAL_TABLET | Freq: Every day | ORAL | 0 refills | Status: AC

## 2017-08-12 NOTE — Patient Instructions (Signed)

## 2017-08-12 NOTE — Progress Notes (Signed)
Patient ID: Megan [name], female    DOB: 01-Apr-1966, 51 y.o.   MRN: [id number]  PCP: Megan Neighbors, FNP  Chief Complaint  Patient presents with  . Follow-up    medication  . Foot Pain    Subjective:  HPI Megan [name] is a 50 y.o. female presents for evaluation of foot pain and on-going chronic chest pain.   Chest Pain Megan [name] complains on intermittent chest pain since her last office visit 05/13/2017. She has not been evaluated for chest pain since her last office visit. She underwent an EKG 05/31/2017 which was significant for NSR and possible left atrial enlargement. Megan [name] reports that chest pain occurs regardless of activity. She characterizes the pain as sharp to aching. She denies any associated shortness of breath, dizziness, or activity intolerance. Reports no prior history of echocardiogram or stress test. No known history of cardiac disorder.  Right Foot Swelling and Bilateral Foot Pain  Megan [name] reports bilateral foot pain times 3 weeks. Pain is localized mostly to the heels of her feet. She characterizes foot pain as aching and sharp foot pain. The pain worseness with weight-bearing. She has attempted relief with ibuprofen, acetaminophen, and warm foot soaks without improvement of symptoms.    Social History   Socioeconomic History  . Marital status: Divorced    Spouse name: Not on file  . Number of children: 3  . Years of education: Not on file  . Highest education level: Not on file  Social Needs  . Financial resource strain: Not on file  . Food insecurity - worry: Not on file  . Food insecurity - inability: Not on file  . Transportation needs - medical: Not on file  . Transportation needs - non-medical: Not on file  Occupational History  . Occupation: disabled  Tobacco Use  . Smoking status: Never Smoker  . Smokeless tobacco: Never Used  Substance and Sexual Activity  . Alcohol use: No  . Drug use: No  . Sexual activity: Not on file  Other Topics  Concern  . Not on file  Social History Narrative  . Not on file    Family History  Problem Relation Age of Onset  . Heart attack Father        Required CABG in his 47'[name].  . Prostate cancer Father   . Stroke Mother   . Hypertension Mother   . Diabetes Mother   . CAD Sister   . Heart attack Sister    Review of Systems  HENT: Negative.   Respiratory: Negative for apnea and chest tightness.   Cardiovascular: Positive for chest pain.  Gastrointestinal: Negative.   Musculoskeletal:       Bilateral heel pain. Right foot swelling     Patient Active Problem List   Diagnosis Date Noted  . Unilateral primary osteoarthritis, left knee 05/03/2017  . Unilateral primary osteoarthritis, right knee 05/03/2017  . Anxiety 04/06/2016  . Chest pain 03/31/2016  . Left shoulder pain     Allergies  Allergen Reactions  . Sulfa Antibiotics Anaphylaxis  . Nsaids Nausea And Vomiting    Prior to Admission medications   Medication Sig Start Date End Date Taking? Authorizing Provider  albuterol (PROVENTIL) (2.5 MG/3ML) 0.083% nebulizer solution Take 3 mLs (2.5 mg total) every 6 (six) hours as needed by nebulization for wheezing or shortness of breath. 08/04/17  Yes Megan Neighbors, FNP  aspirin EC 81 MG tablet Take 81 mg by mouth daily.   Yes [provider]  Megan [name]  100-5 MCG/ACT AERO Inhale 2 puffs into the lungs 2 (two) times daily. 04/18/17  Yes Megan NeighborsHarris, Megan Benbrook S, FNP  gabapentin (NEURONTIN) 300 MG capsule Take 1 capsule (300 mg total) by mouth 3 (three) times daily. 05/31/17  Yes Megan NeighborsHarris, Megan Monts S, FNP  loratadine (CLARITIN) 10 MG tablet Take 1 tablet (10 mg total) by mouth daily. 04/18/17  Yes Megan NeighborsHarris, Megan Longoria S, FNP  montelukast (SINGULAIR) 10 MG tablet Take 1 tablet (10 mg total) by mouth daily. 04/18/17  Yes Megan NeighborsHarris, Eathon Valade S, FNP  Omega-3 Fatty Acids (FISH OIL) 1000 MG CAPS Take 1 capsule (1,000 mg total) by mouth daily. 04/18/17  Yes Megan NeighborsHarris, Megan Vacca S, FNP  promethazine  (PHENERGAN) 25 MG tablet Take 1 tablet (25 mg total) by mouth every 6 (six) hours as needed for nausea or vomiting. 04/20/17  Yes Megan NeighborsHarris, Megan Schul S, FNP  ranitidine (ZANTAC) 150 MG tablet Take 1 tablet (150 mg total) by mouth 2 (two) times daily. 04/18/17  Yes Megan NeighborsHarris, Megan Flood S, FNP  sertraline (ZOLOFT) 100 MG tablet Take 1 tablet (100 mg total) daily by mouth. 08/10/17  Yes Megan NeighborsHarris, Megan Weier S, FNP  simvastatin (ZOCOR) 40 MG tablet Take 1 tablet (40 mg total) by mouth daily. 04/18/17  Yes Megan NeighborsHarris, Megan Mask S, FNP  vitamin E 100 UNIT capsule Take 100 Units by mouth daily.   Yes [provider]    Past Medical, Surgical Family and Social History reviewed and updated.    Objective:   Today'[name] Vitals   08/12/17 1353  BP: 120/65  Pulse: 92  Temp: 97.8 F (36.6 C)  TempSrc: Oral  SpO2: 97%  Weight: 227 lb (103 kg)  Height: 5\' 3"  (1.6 m)    Wt Readings from Last 3 Encounters:  08/12/17 227 lb (103 kg)  07/19/17 225 lb (102.1 kg)  05/31/17 227 lb (103 kg)    Physical Exam  Constitutional: She is oriented to person, place, and time. She appears well-developed.  HENT:  Head: Normocephalic and atraumatic.  Eyes: Pupils are equal, round, and reactive to light.  Neck: Normal range of motion.  Cardiovascular: Normal rate, regular rhythm, normal heart sounds and intact distal pulses.  Pulmonary/Chest: Effort normal and breath sounds normal.  Musculoskeletal:  Trace edema right ankle and foot. Tenderness elicited with mild palpation of bilateral heels.  Neurological: She is alert and oriented to person, place, and time. Coordination normal.  Skin: Skin is warm and dry.  Psychiatric: She has a normal mood and affect. Her behavior is normal. Judgment and thought content normal.   Assessment & Plan:  1. Heel pain, bilateral 2. Swelling of right foot Referring patient to podiatry for further evaluation for what is likely heels spurs. Will trial prednisone 60 mg x 5 days. Continue  Gabapentin 300 mg, 3 times daily for foot pain. Encourage to trial orthotics to improve symptoms of foot pain.  3. Chest pain, unspecified type, intermittent sharp chest pain continuing since last office visit on August. Prior EKG  significant only for possible atrial enlargement. Risk factors for CAD include obesity, inactivity, and prediabetes. Fasting lipid panel pending. She has no showed prior lab appointment to complete lipid panel.  EKG today is unchanged from prior.Due to persistent, ongoing chest pain, I will refer Megan [name] to Cardiology for a second opinion.    Orders Placed This Encounter  Procedures  . Lipid panel  . Ambulatory referral to Podiatry  . Ambulatory referral to Cardiology  . EKG 12-Lead   Meds ordered this encounter  Medications  .  predniSONE (DELTASONE) 20 MG tablet    Sig: Take 3 tablets (60 mg total) daily with breakfast for 5 days by mouth.    Dispense:  15 tablet    Refill:  0    Order Specific Question:   Supervising Provider    Answer:   Megan [name], OLUGBEMIGA E [id number]]    RTC:  5 days lab visit and 12 weeks for wellness visit and repeat A1C   Godfrey PickKimberly [name]. Tiburcio PeaHarris, MSN, FNP-C [facility]  [address] [place], [place] [address] [phone]

## 2017-08-15 ENCOUNTER — Other Ambulatory Visit: Payer: Medicaid Other

## 2017-08-21 ENCOUNTER — Encounter: Payer: Self-pay | Admitting: Family Medicine

## 2017-08-23 ENCOUNTER — Encounter: Payer: Medicaid Other | Admitting: Gastroenterology

## 2017-08-23 ENCOUNTER — Telehealth: Payer: Self-pay | Admitting: Gastroenterology

## 2017-08-23 NOTE — Telephone Encounter (Signed)
Patient son in law states patient woke up sick this morning and could not get out of bed to make appt for today 12.27.18. Reschedule appt to 12.14.18 @1 :30pm

## 2017-08-23 NOTE — Telephone Encounter (Signed)
ok 

## 2017-08-30 ENCOUNTER — Ambulatory Visit: Payer: Medicaid Other

## 2017-09-09 ENCOUNTER — Telehealth: Payer: Self-pay | Admitting: Gastroenterology

## 2017-09-09 ENCOUNTER — Encounter: Payer: Medicaid Other | Admitting: Gastroenterology

## 2017-09-09 NOTE — Telephone Encounter (Signed)
She late cancelled a month ago. No showed today.  She should be charged per policy.  Do not reschedule the procedure, instead offer her ROV if she I still interested.

## 2017-09-12 ENCOUNTER — Ambulatory Visit: Payer: Medicaid Other

## 2017-09-15 ENCOUNTER — Ambulatory Visit
Admission: RE | Admit: 2017-09-15 | Discharge: 2017-09-15 | Disposition: A | Discharge status: Home / Self Care | Payer: Medicaid Other | Source: Ambulatory Visit | Attending: Family Medicine | Admitting: Family Medicine

## 2017-09-15 DIAGNOSIS — Z1239 Encounter for other screening for malignant neoplasm of breast: Secondary | ICD-10-CM

## 2017-10-17 ENCOUNTER — Ambulatory Visit (INDEPENDENT_AMBULATORY_CARE_PROVIDER_SITE_OTHER): Payer: Medicaid Other | Admitting: Family Medicine

## 2017-10-17 ENCOUNTER — Encounter: Payer: Self-pay | Admitting: Family Medicine

## 2017-10-17 VITALS — BP 128/68 | HR 92 | Temp 97.9°F | Resp 16 | Ht 63.0 in | Wt 225.0 lb

## 2017-10-17 DIAGNOSIS — J341 Cyst and mucocele of nose and nasal sinus: Secondary | ICD-10-CM

## 2017-10-17 DIAGNOSIS — J0100 Acute maxillary sinusitis, unspecified: Secondary | ICD-10-CM | POA: Diagnosis not present

## 2017-10-17 DIAGNOSIS — Z1389 Encounter for screening for other disorder: Secondary | ICD-10-CM | POA: Diagnosis not present

## 2017-10-17 LAB — POCT URINALYSIS DIP (DEVICE)
Glucose, UA: NEGATIVE mg/dL
Leukocytes, UA: NEGATIVE
NITRITE: NEGATIVE
PH: 5.5 (ref 5.0–8.0)
PROTEIN: NEGATIVE mg/dL
Specific Gravity, Urine: >=1.03 (ref 1.005–1.030)
Urobilinogen, UA: 0.2 mg/dL (ref 0.0–1.0)

## 2017-10-17 MED ORDER — PROMETHAZINE-CODEINE 6.25-10 MG/5ML PO SYRP: 5.0000 mL | ORAL_SOLUTION | Freq: Four times a day (QID) | ORAL | 0 refills | Status: AC | PRN

## 2017-10-17 MED ORDER — PREDNISONE 20 MG PO TABS: 40.0000 mg | ORAL_TABLET | Freq: Every day | ORAL | 0 refills | Status: AC

## 2017-10-17 NOTE — Telephone Encounter (Signed)
Pt has medicaid unable to bill cancel fee.

## 2017-10-17 NOTE — Progress Notes (Signed)
Megan [name]  01/30/1966 [id number]  Chief Complaint  Patient presents with  . Cough  . Shortness of Breath  . Ear Pain  . Sore Throat  . Nasal Congestion    Subjective:  Megan [name] is a 52 y.o. female who presents for evaluation of possible sinusitis.  Symptoms include facial pain, shortness of breath, sinus pressure, sore throat and wheezing.  Onset of symptoms was 4 days ago, and has been gradually improving since that time.  Treatment to date:  acetaminophen.  High risk factors for influenza complications:  none. Sinus cyst right sided, ENT. History of chronic sinus infections.  Social History   Socioeconomic History  . Marital status: Divorced    Spouse name: Not on file  . Number of children: 3  . Years of education: Not on file  . Highest education level: Not on file  Social Needs  . Financial resource strain: Not on file  . Food insecurity - worry: Not on file  . Food insecurity - inability: Not on file  . Transportation needs - medical: Not on file  . Transportation needs - non-medical: Not on file  Occupational History  . Occupation: disabled  Tobacco Use  . Smoking status: Never Smoker  . Smokeless tobacco: Never Used  Substance and Sexual Activity  . Alcohol use: No  . Drug use: No  . Sexual activity: Not on file  Other Topics Concern  . Not on file  Social History Narrative  . Not on file    Family History  Problem Relation Age of Onset  . Heart attack Father        Required CABG in his 17's.  . Prostate cancer Father   . Stroke Mother   . Hypertension Mother   . Diabetes Mother   . CAD Sister   . Heart attack Sister   . Breast cancer Maternal Aunt        unsure of age    Constitutional: positive for chills and fatigue Ears, nose, mouth, throat, and face: positive for nasal congestion, sore throat and ear fullness, sneezing, sinus pressure with facial pain . Respiratory: positive for cough Cardiovascular: negative Gastrointestinal:  negative Neurological: positive for headaches Objective:  BP 128/68 (BP Location: Left Arm, Patient Position: Sitting, Cuff Size: Large)   Pulse 92   Temp 97.9 F (36.6 C) (Oral)   Resp 16   Ht 5\' 3"  (1.6 m)   Wt 225 lb (102.1 kg)   SpO2 96%   BMI 39.86 kg/m  General appearance: alert, cooperative and no distress Ears: normal TM's and external ear canals both ears Nose: serous discharge, moderate congestion, turbinates red, right turbinate red, left turbinate pink, sinus tenderness bilateral Throat: lips, mucosa, and tongue normal; teeth and gums normal Lungs: clear to auscultation bilaterally Heart: regular rate and rhythm, S1, S2 normal, no murmur, click, rub or gallop Lymph nodes: Cervical adenopathy: negative  Assessment:  viral upper respiratory illness    Plan:  Discussed diagnosis and treatment of URI. Discussed the importance of avoiding unnecessary antibiotic therapy. prednisonse sinus inflammation and phenergan with codine for cough per orders. Follow up in 7 days or as needed.  Meds ordered this encounter  Medications  . promethazine-codeine (PHENERGAN WITH CODEINE) 6.25-10 MG/5ML syrup    Sig: Take 5 mLs by mouth every 6 (six) hours as needed for cough.    Dispense:  120 mL    Refill:  0    Order Specific Question:   Supervising Provider  AnswerQuentin Angst:   JEGEDE, OLUGBEMIGA E [id number]]  . predniSONE (DELTASONE) 20 MG tablet    Sig: Take 2 tablets (40 mg total) by mouth daily with breakfast.    Dispense:  10 tablet    Refill:  0    Order Specific Question:   Supervising Provider    Answer:   Quentin AngstJEGEDE, OLUGBEMIGA E [[id number]]    Orders Placed This Encounter  Procedures  . Ambulatory referral to ENT  . POCT urinalysis dip (device)    RTC: as needed  Megan PickKimberly S. Tiburcio PeaHarris, MSN, FNP-C [facility]  [address] [place], [place] [address] [phone]

## 2017-10-18 ENCOUNTER — Ambulatory Visit: Payer: Medicaid Other | Admitting: Family Medicine

## 2017-10-28 ENCOUNTER — Ambulatory Visit: Payer: Self-pay | Admitting: Podiatry

## 2017-11-01 ENCOUNTER — Ambulatory Visit: Payer: Medicaid Other | Admitting: Cardiovascular Disease

## 2017-11-17 ENCOUNTER — Encounter: Payer: Self-pay | Admitting: Cardiovascular Disease

## 2017-11-28 ENCOUNTER — Ambulatory Visit: Payer: Medicaid Other | Admitting: Family Medicine

## 2018-05-08 IMAGING — DX DG KNEE COMPLETE 4+V*L*
4 series · 4 of 4 positions shown · non-contrast
Comparison: Right knee radiographs of the same day.

CLINICAL DATA: Bilateral knee pain for 1 month.

EXAM:
LEFT KNEE - COMPLETE 4+ VIEW

[knee ap]
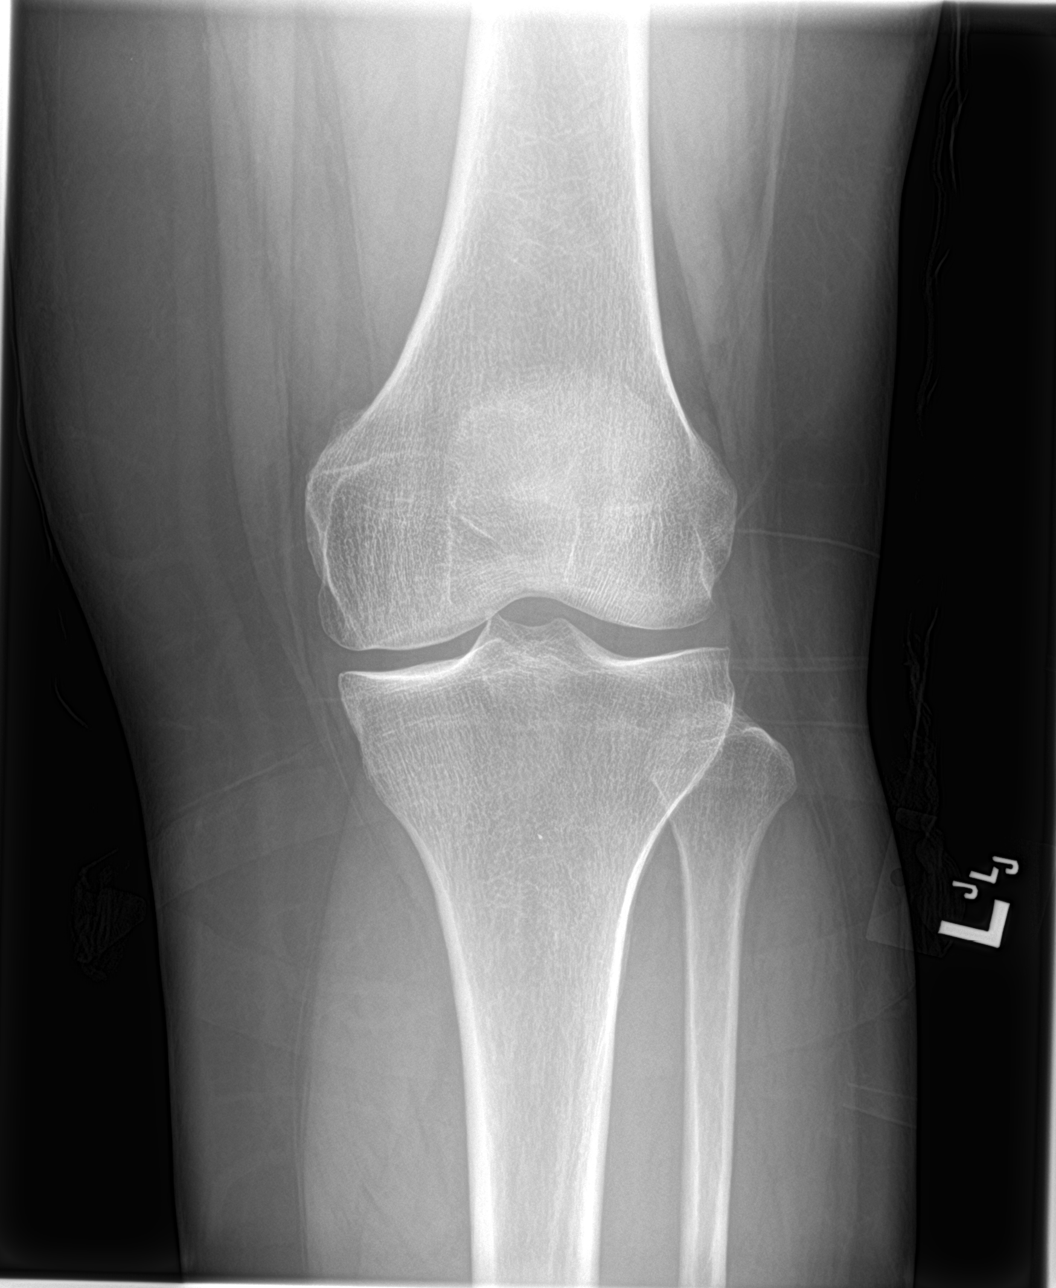

[tunnel]
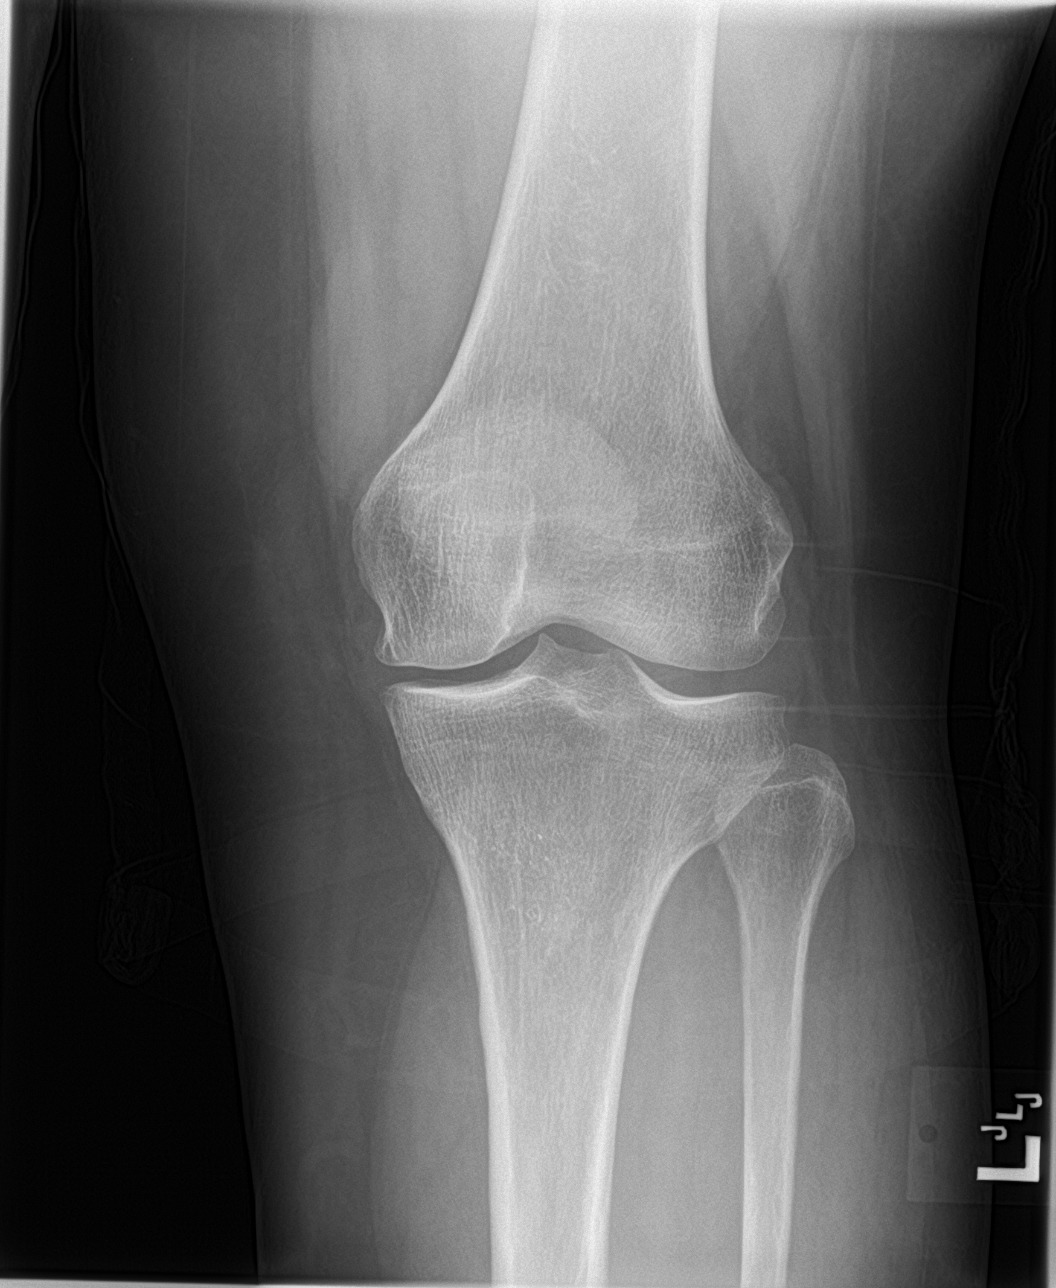

[knee lat]
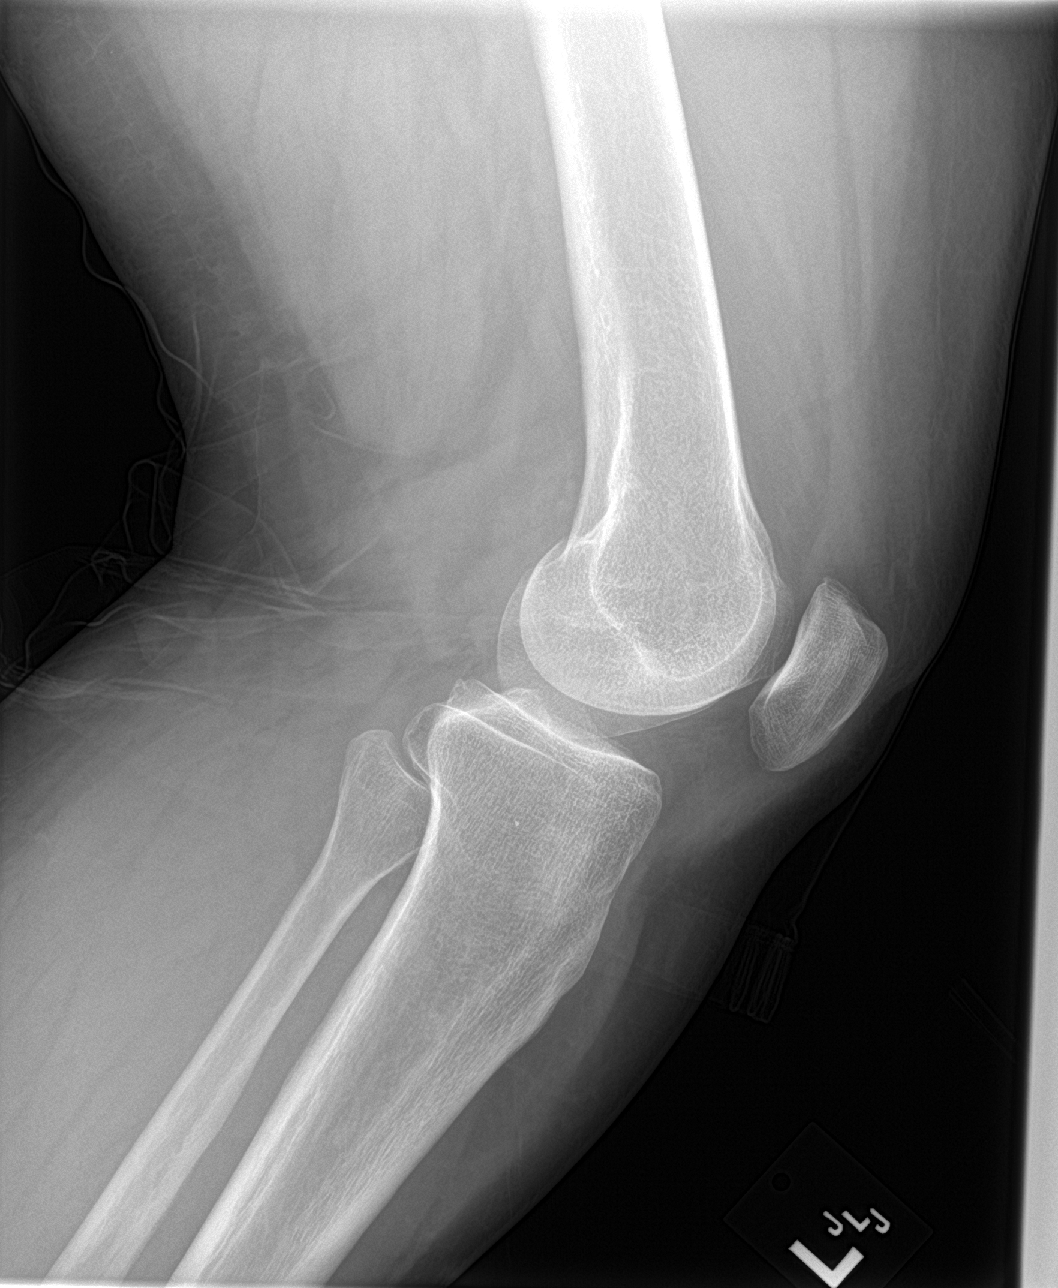

[knee obl]
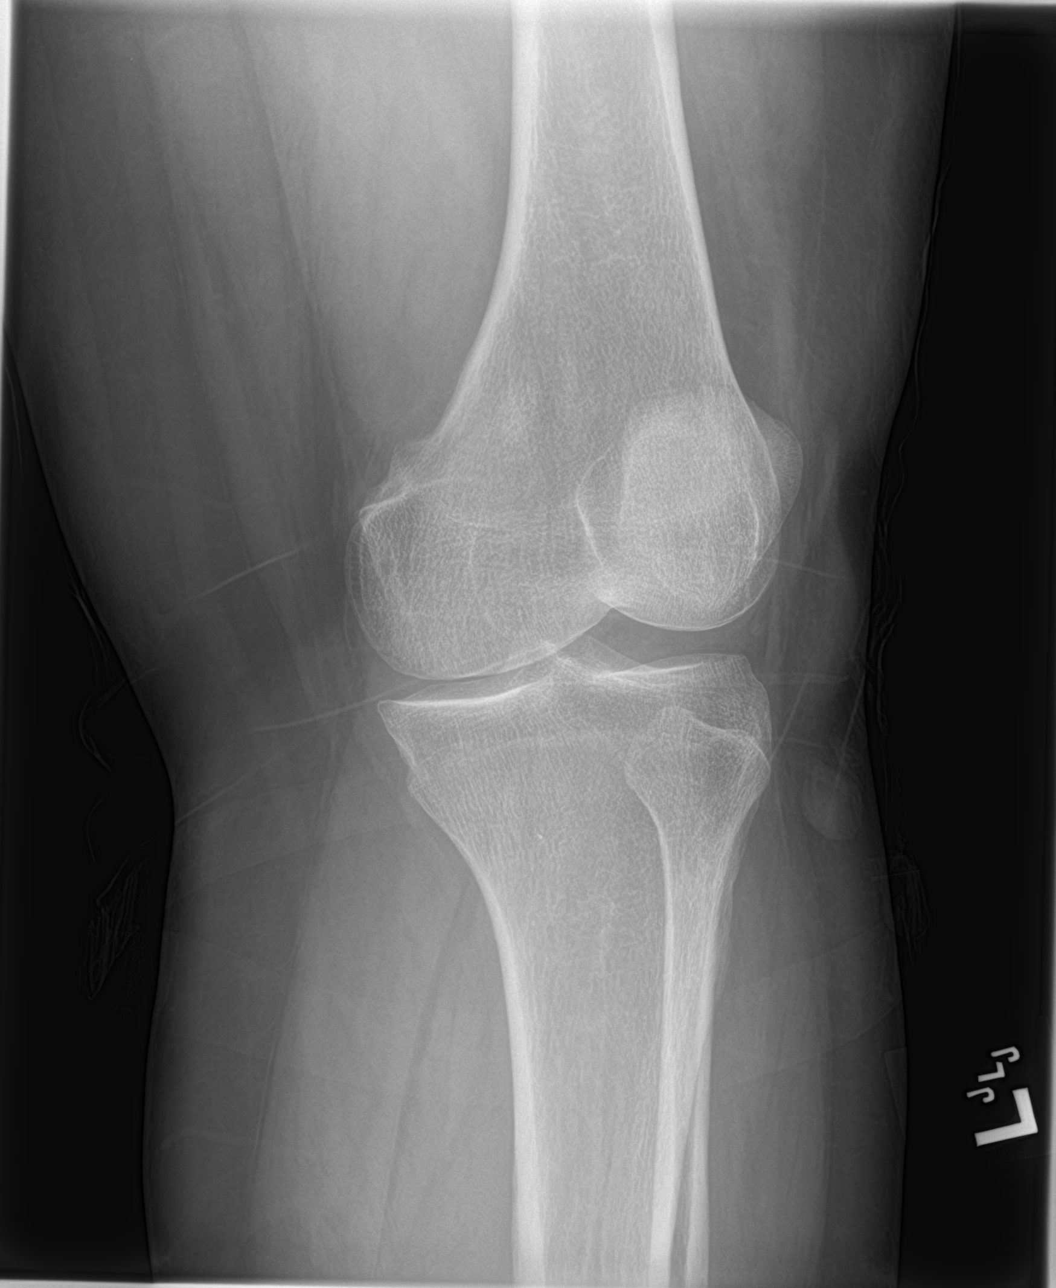

[4 of 4 positions shown; findings below may reference images not displayed]

FINDINGS: No evidence of fracture, dislocation, or joint effusion. No evidence
of arthropathy or other focal bone abnormality. Soft tissues are
unremarkable.
IMPRESSION: Negative left knee radiographs.

## 2018-06-07 ENCOUNTER — Other Ambulatory Visit: Payer: Self-pay | Admitting: Family Medicine

## 2018-07-18 ENCOUNTER — Other Ambulatory Visit: Payer: Self-pay | Admitting: Family Medicine

## 2018-07-18 NOTE — Telephone Encounter (Signed)
Phenergan refill.   [facility] does not refill for Joaquin Courts
